# Patient Record
Sex: Male | Born: 1965 | Race: White | Hispanic: No | Smoking: Never smoker
Health system: Southern US, Community
[De-identification: ages and names within clinical notes are randomized; demographics above are authoritative.]

## PROBLEM LIST (undated history)

## (undated) DIAGNOSIS — H47011 Ischemic optic neuropathy, right eye: Secondary | ICD-10-CM

## (undated) DIAGNOSIS — M109 Gout, unspecified: Secondary | ICD-10-CM

## (undated) DIAGNOSIS — L409 Psoriasis, unspecified: Secondary | ICD-10-CM

## (undated) DIAGNOSIS — K56609 Unspecified intestinal obstruction, unspecified as to partial versus complete obstruction: Secondary | ICD-10-CM

## (undated) DIAGNOSIS — J189 Pneumonia, unspecified organism: Secondary | ICD-10-CM

## (undated) DIAGNOSIS — I10 Essential (primary) hypertension: Secondary | ICD-10-CM

## (undated) HISTORY — DX: Gout, unspecified: M10.9

## (undated) HISTORY — DX: Essential (primary) hypertension: I10

## (undated) HISTORY — PX: HERNIA REPAIR: SHX51

## (undated) HISTORY — DX: Psoriasis, unspecified: L40.9

---
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
## 1999-03-21 ENCOUNTER — Emergency Department (HOSPITAL_COMMUNITY): Admission: EM | Admit: 1999-03-21 | Discharge: 1999-03-22 | Payer: Self-pay | Admitting: Emergency Medicine

## 2001-08-22 HISTORY — PX: LASIK: SHX215

## 2006-12-28 ENCOUNTER — Emergency Department (HOSPITAL_COMMUNITY): Admission: EM | Admit: 2006-12-28 | Discharge: 2006-12-28 | Payer: Self-pay | Admitting: Family Medicine

## 2007-01-01 ENCOUNTER — Emergency Department (HOSPITAL_COMMUNITY): Admission: EM | Admit: 2007-01-01 | Discharge: 2007-01-01 | Payer: Self-pay | Admitting: Family Medicine

## 2007-01-22 ENCOUNTER — Ambulatory Visit: Payer: Self-pay | Admitting: Internal Medicine

## 2007-01-22 DIAGNOSIS — I10 Essential (primary) hypertension: Secondary | ICD-10-CM | POA: Insufficient documentation

## 2007-01-22 LAB — CONVERTED CEMR LAB
ALT: 20 units/L (ref 0–40)
AST: 21 units/L (ref 0–37)
Albumin: 4.3 g/dL (ref 3.5–5.2)
Alkaline Phosphatase: 48 units/L (ref 39–117)
BUN: 16 mg/dL (ref 6–23)
Basophils Absolute: 0 10*3/uL (ref 0.0–0.1)
Basophils Relative: 0.6 % (ref 0.0–1.0)
Bilirubin, Direct: 0.1 mg/dL (ref 0.0–0.3)
CO2: 30 meq/L (ref 19–32)
Calcium: 9.3 mg/dL (ref 8.4–10.5)
Chloride: 105 meq/L (ref 96–112)
Cholesterol: 196 mg/dL (ref 0–200)
Creatinine, Ser: 0.9 mg/dL (ref 0.4–1.2)
Eosinophils Absolute: 0.1 10*3/uL (ref 0.0–0.6)
Eosinophils Relative: 2.2 % (ref 0.0–5.0)
GFR calc Af Amer: 89 mL/min
GFR calc non Af Amer: 74 mL/min
Glucose, Bld: 86 mg/dL (ref 70–99)
HCT: 42.4 % (ref 36.0–46.0)
HDL: 44.4 mg/dL (ref >39.0)
Hemoglobin: 15.1 g/dL — ABNORMAL HIGH (ref 12.0–15.0)
LDL Cholesterol: 138 mg/dL — ABNORMAL HIGH (ref 0–99)
Lymphocytes Relative: 35.5 % (ref 12.0–46.0)
MCHC: 35.6 g/dL (ref 30.0–36.0)
MCV: 90.2 fL (ref 78.0–100.0)
Monocytes Absolute: 0.5 10*3/uL (ref 0.2–0.7)
Monocytes Relative: 9.8 % (ref 3.0–11.0)
Neutro Abs: 2.6 10*3/uL (ref 1.4–7.7)
Neutrophils Relative %: 51.9 % (ref 43.0–77.0)
Platelets: 190 10*3/uL (ref 150–400)
Potassium: 4 meq/L (ref 3.5–5.1)
RBC: 4.71 M/uL (ref 3.87–5.11)
RDW: 12.5 % (ref 11.5–14.6)
Sodium: 141 meq/L (ref 135–145)
TSH: 2.52 microintl units/mL (ref 0.35–5.50)
Total Bilirubin: 0.8 mg/dL (ref 0.3–1.2)
Total CHOL/HDL Ratio: 4.4
Total Protein: 6.8 g/dL (ref 6.0–8.3)
Triglycerides: 68 mg/dL (ref 0–149)
VLDL: 14 mg/dL (ref 0–40)
WBC: 5 10*3/uL (ref 4.5–10.5)

## 2007-09-05 ENCOUNTER — Ambulatory Visit: Payer: Self-pay | Admitting: Internal Medicine

## 2008-01-22 ENCOUNTER — Encounter: Payer: Self-pay | Admitting: Internal Medicine

## 2008-02-08 ENCOUNTER — Ambulatory Visit: Payer: Self-pay | Admitting: Internal Medicine

## 2008-02-08 LAB — CONVERTED CEMR LAB
ALT: 22 units/L (ref 0–53)
AST: 20 units/L (ref 0–37)
Albumin: 4.2 g/dL (ref 3.5–5.2)
Alkaline Phosphatase: 43 units/L (ref 39–117)
BUN: 19 mg/dL (ref 6–23)
Basophils Absolute: 0 10*3/uL (ref 0.0–0.1)
Basophils Relative: 0.2 % (ref 0.0–1.0)
Bilirubin Urine: NEGATIVE
Bilirubin, Direct: 0.1 mg/dL (ref 0.0–0.3)
Blood in Urine, dipstick: NEGATIVE
CO2: 29 meq/L (ref 19–32)
Calcium: 9.1 mg/dL (ref 8.4–10.5)
Chloride: 105 meq/L (ref 96–112)
Cholesterol: 176 mg/dL (ref 0–200)
Creatinine, Ser: 1.1 mg/dL (ref 0.4–1.5)
Eosinophils Absolute: 0.3 10*3/uL (ref 0.0–0.7)
Eosinophils Relative: 6.9 % — ABNORMAL HIGH (ref 0.0–5.0)
GFR calc Af Amer: 95 mL/min
GFR calc non Af Amer: 78 mL/min
Glucose, Bld: 92 mg/dL (ref 70–99)
Glucose, Urine, Semiquant: NEGATIVE
HCT: 45.4 % (ref 39.0–52.0)
HDL: 40.9 mg/dL (ref >39.0)
Hemoglobin: 16.2 g/dL (ref 13.0–17.0)
LDL Cholesterol: 120 mg/dL — ABNORMAL HIGH (ref 0–99)
Lymphocytes Relative: 37.6 % (ref 12.0–46.0)
MCHC: 35.6 g/dL (ref 30.0–36.0)
MCV: 93.3 fL (ref 78.0–100.0)
Monocytes Absolute: 0.5 10*3/uL (ref 0.1–1.0)
Monocytes Relative: 10.2 % (ref 3.0–12.0)
Neutro Abs: 2 10*3/uL (ref 1.4–7.7)
Neutrophils Relative %: 45.1 % (ref 43.0–77.0)
Nitrite: NEGATIVE
Platelets: 195 10*3/uL (ref 150–400)
Potassium: 4 meq/L (ref 3.5–5.1)
Protein, U semiquant: NEGATIVE
RBC: 4.86 M/uL (ref 4.22–5.81)
RDW: 12.1 % (ref 11.5–14.6)
Sodium: 140 meq/L (ref 135–145)
Specific Gravity, Urine: >=1.03
TSH: 1.87 microintl units/mL (ref 0.35–5.50)
Total Bilirubin: 0.9 mg/dL (ref 0.3–1.2)
Total CHOL/HDL Ratio: 4.3
Total Protein: 6.8 g/dL (ref 6.0–8.3)
Triglycerides: 76 mg/dL (ref 0–149)
Urobilinogen, UA: 0.2
VLDL: 15 mg/dL (ref 0–40)
WBC Urine, dipstick: NEGATIVE
WBC: 4.5 10*3/uL (ref 4.5–10.5)
pH: 5

## 2008-02-19 ENCOUNTER — Ambulatory Visit: Payer: Self-pay | Admitting: Internal Medicine

## 2008-02-20 ENCOUNTER — Ambulatory Visit (HOSPITAL_COMMUNITY): Admission: RE | Admit: 2008-02-20 | Discharge: 2008-02-20 | Payer: Self-pay | Admitting: Emergency Medicine

## 2009-08-17 ENCOUNTER — Ambulatory Visit: Payer: Self-pay | Admitting: Internal Medicine

## 2009-08-27 ENCOUNTER — Ambulatory Visit: Payer: Self-pay | Admitting: Psychology

## 2009-11-05 ENCOUNTER — Ambulatory Visit: Payer: Self-pay | Admitting: Psychology

## 2009-11-18 ENCOUNTER — Ambulatory Visit: Payer: Self-pay | Admitting: Psychology

## 2009-11-24 ENCOUNTER — Ambulatory Visit: Payer: Self-pay | Admitting: Psychology

## 2009-12-20 ENCOUNTER — Emergency Department (HOSPITAL_COMMUNITY): Admission: EM | Admit: 2009-12-20 | Discharge: 2009-12-20 | Payer: Self-pay | Admitting: Family Medicine

## 2010-02-08 ENCOUNTER — Ambulatory Visit: Payer: Self-pay | Admitting: Internal Medicine

## 2010-02-08 LAB — CONVERTED CEMR LAB
ALT: 32 units/L (ref 0–53)
AST: 27 units/L (ref 0–37)
Albumin: 4.1 g/dL (ref 3.5–5.2)
Alkaline Phosphatase: 42 units/L (ref 39–117)
BUN: 19 mg/dL (ref 6–23)
Basophils Absolute: 0 10*3/uL (ref 0.0–0.1)
Basophils Relative: 0.5 % (ref 0.0–3.0)
Bilirubin Urine: NEGATIVE
Bilirubin, Direct: 0.1 mg/dL (ref 0.0–0.3)
CO2: 27 meq/L (ref 19–32)
Calcium: 9 mg/dL (ref 8.4–10.5)
Chloride: 110 meq/L (ref 96–112)
Cholesterol: 187 mg/dL (ref 0–200)
Creatinine, Ser: 0.9 mg/dL (ref 0.4–1.5)
Eosinophils Absolute: 0.2 10*3/uL (ref 0.0–0.7)
Eosinophils Relative: 4.7 % (ref 0.0–5.0)
GFR calc non Af Amer: 97.63 mL/min (ref >60)
Glucose, Bld: 88 mg/dL (ref 70–99)
Glucose, Urine, Semiquant: NEGATIVE
HCT: 43.2 % (ref 39.0–52.0)
HDL: 42.9 mg/dL (ref >39.00)
Hemoglobin: 15 g/dL (ref 13.0–17.0)
Ketones, urine, test strip: NEGATIVE
LDL Cholesterol: 123 mg/dL — ABNORMAL HIGH (ref 0–99)
Lymphocytes Relative: 35.2 % (ref 12.0–46.0)
Lymphs Abs: 1.8 10*3/uL (ref 0.7–4.0)
MCHC: 34.7 g/dL (ref 30.0–36.0)
MCV: 92.5 fL (ref 78.0–100.0)
Monocytes Absolute: 0.5 10*3/uL (ref 0.1–1.0)
Monocytes Relative: 8.8 % (ref 3.0–12.0)
Neutro Abs: 2.6 10*3/uL (ref 1.4–7.7)
Neutrophils Relative %: 50.8 % (ref 43.0–77.0)
Nitrite: NEGATIVE
Platelets: 178 10*3/uL (ref 150.0–400.0)
Potassium: 3.9 meq/L (ref 3.5–5.1)
Protein, U semiquant: NEGATIVE
RBC: 4.67 M/uL (ref 4.22–5.81)
RDW: 13.4 % (ref 11.5–14.6)
Sodium: 143 meq/L (ref 135–145)
Specific Gravity, Urine: 1.025
TSH: 1.37 microintl units/mL (ref 0.35–5.50)
Total Bilirubin: 0.8 mg/dL (ref 0.3–1.2)
Total CHOL/HDL Ratio: 4
Total Protein: 6.4 g/dL (ref 6.0–8.3)
Triglycerides: 107 mg/dL (ref 0.0–149.0)
Urobilinogen, UA: 0.2
VLDL: 21.4 mg/dL (ref 0.0–40.0)
WBC Urine, dipstick: NEGATIVE
WBC: 5.2 10*3/uL (ref 4.5–10.5)
pH: 5.5

## 2010-02-16 ENCOUNTER — Ambulatory Visit: Payer: Self-pay | Admitting: Psychology

## 2010-02-24 ENCOUNTER — Ambulatory Visit: Payer: Self-pay | Admitting: Internal Medicine

## 2010-04-13 ENCOUNTER — Emergency Department (HOSPITAL_COMMUNITY): Admission: EM | Admit: 2010-04-13 | Discharge: 2010-04-13 | Payer: Self-pay | Admitting: Family Medicine

## 2010-09-21 NOTE — Assessment & Plan Note (Signed)
Summary: cpx//ccm   Vital Signs:  Patient profile:   45 year old male Height:      69 inches Weight:      228 pounds BMI:     33.79 Pulse rate:   68 / minute Pulse rhythm:   regular Resp:     12 per minute BP sitting:   112 / 86  (left arm) Cuff size:   regular  Vitals Entered By: Gladis Riffle, RN (February 24, 2010 8:12 AM) CC: cpx, labs done Is Patient Diabetic? No   CC:  cpx and labs done.  History of Present Illness: cpx stopped BP meds---home BPs 120s/80s or less  Preventive Screening-Counseling & Management  Alcohol-Tobacco     Alcohol drinks/day: <1     Smoking Status: never  Current Problems (verified): 1)  R/O Adhd  (ICD-314.01) 2)  Preventive Health Care  (ICD-V70.0) 3)  Hypertension  (ICD-401.9)  Current Medications (verified): 1)  Clobetasol Propionate 0.05 % Soln (Clobetasol Propionate) .... Apply To Scalp As Needed As Directed 2)  Mometasone Furoate 0.1 % Crea (Mometasone Furoate) .... Apply To Psoriasis As Directed  Allergies (verified): 1)  ! 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  Divorced (remarried) Never Smoked Alcohol use-yes-6pk/week Regular exercise-yes--typically just weight lifting 1 dtr  Risk Factors: Alcohol Use: <1 (02/24/2010) Exercise: yes (01/22/2007)  Risk Factors: Smoking Status: never (02/24/2010)  Family History: Family History Lung cancer-66yo mother-smoker Family History of CAD Male 1st degree relative father CABG 55s (died age 61) Family History Diabetes 1st degree relative father 1/2 sister with lung CA  deceased 53 yo  Social History: 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   Head:  normocephalic and atraumatic.   Eyes:  pupils equal and pupils round.   Ears:  R ear normal and L ear normal.   Neck:  No deformities, masses, or tenderness noted. Chest Wall:  No deformities, masses, tenderness or gynecomastia noted. Lungs:  Normal respiratory effort, chest expands symmetrically. Lungs are clear to auscultation, no crackles or wheezes. Heart:  normal rate and regular rhythm.   Abdomen:  Bowel sounds positive,abdomen soft and non-tender without masses, organomegaly or hernias noted. Rectal:  no external abnormalities and no hemorrhoids.   Prostate:  no gland enlargement, no nodules, and no asymmetry.   Msk:  No deformity or scoliosis noted of thoracic or lumbar spine.   Pulses:  R radial normal and L radial normal.   Neurologic:  cranial nerves II-XII intact and gait normal.   Skin:  turgor normal and color normal.   Psych:  memory intact for recent and remote and not anxious appearing.     Impression & Recommendations:  Problem # 1:  PREVENTIVE HEALTH CARE (ICD-V70.0)  health maint UTD  Problem # 2:  HYPERTENSION (ICD-401.9)  BP well controlled off meds The following medications were removed from the medication list:    Enalapril Maleate 10 Mg Tabs (Enalapril maleate) .Marland Kitchen... Take 1 tablet by mouth once a day  BP today: 112/86 Prior BP: 130/90 (08/17/2009)  Prior  10 Yr Risk Heart Disease: Not enough information (01/22/2007)  Labs Reviewed: K+: 3.9 (02/08/2010) Creat: : 0.9 (02/08/2010)   Chol: 187 (02/08/2010)   HDL: 42.90 (02/08/2010)   LDL: 123 (02/08/2010)   TG: 107.0 (02/08/2010)  Complete Medication List: 1)  Clobetasol Propionate 0.05 % Soln (Clobetasol propionate) .... Apply to scalp as needed as  directed 2)  Mometasone Furoate 0.1 % Crea (Mometasone furoate) .... Apply to psoriasis as directed

## 2010-09-21 NOTE — Assessment & Plan Note (Signed)
Summary: concult re: anger issues/? ADD/cjr   Vital Signs:  Patient profile:   45 year old male Height:      69 inches Weight:      224 pounds BMI:     33.20 BP sitting:   130 / 90  (left arm) Cuff size:   regular  Vitals Entered By: Kern Reap CMA Duncan Dull) (August 17, 2009 9:56 AM)  Reason for Visit possible add, behavior issues  History of Present Illness: a lot of stress re cently.  married---recently wife is pregnant he admits to anger issues, wife tells him he doesn't listen.  Current Problems (verified): 1)  Preventive Health Care  (ICD-V70.0) 2)  Hypertension  (ICD-401.9)  Current Medications (verified): 1)  Enalapril Maleate 10 Mg Tabs (Enalapril Maleate) .... 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   Head:  normocephalic and atraumatic.   Eyes:  pupils equal and pupils round.   Ears:  R ear normal and L ear normal.   Neck:  No deformities, masses, or tenderness noted. Chest Wall:  No deformities, masses, tenderness or gynecomastia noted. Lungs:  Normal respiratory effort, chest expands symmetrically. Lungs are clear to auscultation, no crackles or wheezes. Heart:  Normal  rate and regular rhythm. S1 and S2 normal without gallop, murmur, click, rub or other extra sounds. Abdomen:  Bowel sounds positive,abdomen soft and non-tender without masses, organomegaly or hernias noted. Msk:  No deformity or scoliosis noted of thoracic or lumbar spine.   Pulses:  R radial normal and L radial normal.   Neurologic:  cranial nerves II-XII intact and gait normal.     Impression & Recommendations:  Problem # 1:  HYPERTENSION (ICD-401.9) he will start being compliant His updated medication list for this problem includes:    Enalapril Maleate 10 Mg Tabs (Enalapril maleate) .Marland Kitchen... Take 1 tablet by mouth once a day  BP today: 130/90 Prior BP: 124/90 (02/19/2008)  Prior 10 Yr Risk Heart Disease: Not enough information (01/22/2007)  Labs Reviewed: K+: 4.0 (02/08/2008) Creat: : 1.1 (02/08/2008)   Chol: 176 (02/08/2008)   HDL: 40.9 (02/08/2008)   LDL: 120 (02/08/2008)   TG: 76 (02/08/2008)  Problem # 2:  R/O ADHD (ICD-314.01)  he works 3-4 shifts at night per week  Orders: Psychology Referral (Psychology)  Complete Medication List: 1)  Enalapril Maleate 10 Mg Tabs (Enalapril maleate) .... Take 1 tablet by mouth once a day Prescriptions: ENALAPRIL MALEATE 10 MG TABS (ENALAPRIL MALEATE) Take 1 tablet by mouth once a day  #90 x 3   Entered and Authorized by:   Birdie Sons MD   Signed by:   Birdie Sons MD on 08/17/2009   Method used:   Electronically to        [facility]* (retail)  [address]       [address] Shipping/mailing       [place], [place]  [address]       Ph: [phone]       Fax: [phone]   RxID:   [id number]

## 2010-09-21 NOTE — Assessment & Plan Note (Signed)
History of Present Illness: HTN-no headache no neurologic deficit-tolerating meds (enalapril) without dificulty. Home BPs 120s/80s  Otherwise feels well  Hypertension History:      She denies headache, chest pain, palpitations, dyspnea with exertion, orthopnea, PND, peripheral edema, visual symptoms, neurologic problems, syncope, and side effects from treatment.        Positive major cardiovascular risk factors include hypertension and family history for ischemic heart disease (males less than 64 years old).  Negative major cardiovascular risk factors include male age less than 47 years old, no history of diabetes or hyperlipidemia, and non-tobacco-user status.        Further assessment for target organ damage reveals no history of ASHD, cardiac end-organ damage (CHF/LVH), stroke/TIA, peripheral vascular disease, renal insufficiency, or hypertensive retinopathy.       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        Divorced    Never Smoked    Alcohol use-yes-6pk/week    Regular exercise-yes--typically just weight lifting   Risk Factors:  Tobacco use:  never Alcohol use:  yes    Drinks per day:  <1    Counseled to quit/cut down alcohol use:  no Exercise:  yes  Family History Risk Factors:    Family History of MI in males < 36 years old:  yes   Review of Systems  The patient denies anorexia, fever, weight loss, vision loss, decreased hearing, hoarseness, chest pain, syncope, dyspnea on exhertion, peripheral edema, prolonged cough, hemoptysis, abdominal pain, melena, hematochezia, severe indigestion/heartburn, hematuria, incontinence, genital sores, muscle weakness, suspicious skin lesions, transient  blindness, difficulty walking, depression, unusual weight change, abnormal bleeding, enlarged lymph nodes, angioedema, breast masses, and testicular masses.     Physical Exam  General:     Well-developed,well-nourished,in no acute distress; alert,appropriate and cooperative throughout examination Head:     Normocephalic and atraumatic without obvious abnormalities. No apparent alopecia or balding. Nose:     External nasal examination shows no deformity or inflammation. Nasal mucosa are pink and moist without lesions or exudates. Mouth:     Oral mucosa and oropharynx without lesions or exudates.  Teeth in good repair. Neck:     No deformities, masses, or tenderness noted. Heart:     Normal rate and regular rhythm. S1 and S2 normal without gallop, murmur, click, rub or other extra sounds. Abdomen:     Bowel sounds positive,abdomen soft and non-tender without masses, organomegaly or hernias noted. Msk:     No deformity or scoliosis noted of thoracic or lumbar spine.   Neurologic:     No cranial nerve deficits noted. Station and gait are normal. Plantar reflexes are down-going bilaterally. DTRs are symmetrical throughout. Sensory, motor and coordinative functions appear intact.    Impression & Recommendations:  Problem # 1:  HYPERTENSION (ICD-401.9)  10 Yr Risk Heart Disease: Not enough information   adequate control enalapril  Problem # 2:  Preventive Health Care (ICD-V70.0) cpx labs daily exercise encouraged. Weight loss advised aerobiic exercise  Hypertension Assessment/Plan:      The patient's hypertensive risk group is category B: At least one risk factor (excluding diabetes) with no target organ damage.

## 2010-09-21 NOTE — Assessment & Plan Note (Signed)
Summary: cant sleep/mhf  Medications Added ENALAPRIL MALEATE 10 MG TABS (ENALAPRIL MALEATE) Take 1 tablet by mouth once a day AMBIEN 5 MG TABS (ZOLPIDEM TARTRATE) 1-2 by mouth at bedtime as needed      Allergies Added: NKDA  Vital Signs:  Patient Profile:   45 Years Old Male Weight:      208 pounds Temp:     97.9 degrees F Pulse rate:   66 / minute BP sitting:   100 / 72  (left arm)  Vitals Entered By: Gladis Riffle, RN (September 05, 2007 1:43 PM)                 Chief Complaint:  c/o difficulty sleeping X 2 years and getting worse--does work 3rd shift.  History of Present Illness: Works 3rd shift---7pm-7am goes to sleep at 8am. Wakes up at noon or 1 pm.   admits to depression---recent break up---no suicidal thoughts.   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   Head:     normocephalic and atraumatic.   Eyes:     pupils equal and pupils round.   Neck:     No deformities, masses, or tenderness noted. Lungs:     Normal respiratory effort, chest expands symmetrically. Lungs are clear to auscultation, no crackles or wheezes. Heart:     Normal rate and regular rhythm. S1 and S2  normal without gallop, murmur, click, rub or other extra sounds. Psych:     normally interactive, good eye contact, not anxious appearing, and not agitated.      Impression & Recommendations:  Problem # 1:  INSOMNIA (ICD-780.52) trail meds insomnia is likely secondary to shift work His updated medication list for this problem includes:    Ambien 5 Mg Tabs (Zolpidem tartrate) .Marland Kitchen... 1-2 by mouth at bedtime as needed  grief reaction/stress reaction related to break up with girlfriend---call in one month if not improving  Complete Medication List: 1)  Enalapril Maleate 10 Mg Tabs (Enalapril maleate) .... Take 1 tablet by mouth once a day 2)  Ambien 5 Mg Tabs (Zolpidem tartrate) .Marland Kitchen.. 1-2 by mouth at bedtime as needed     Prescriptions: AMBIEN 5 MG TABS (ZOLPIDEM TARTRATE) 1-2 by mouth at bedtime as needed  #30 x 1   Entered and Authorized by:   Birdie Sons MD   Signed by:   Birdie Sons MD on 09/05/2007  Method used:   Print then Give to Patient   RxID:   [id number]  ]

## 2010-11-09 LAB — POCT RAPID STREP A (OFFICE): Streptococcus, Group A Screen (Direct): POSITIVE — AB

## 2011-03-11 ENCOUNTER — Inpatient Hospital Stay (INDEPENDENT_AMBULATORY_CARE_PROVIDER_SITE_OTHER)
Admission: RE | Admit: 2011-03-11 | Discharge: 2011-03-11 | Disposition: A | Discharge status: Home / Self Care | Payer: 59 | Source: Ambulatory Visit | Attending: Emergency Medicine | Admitting: Emergency Medicine

## 2011-03-11 DIAGNOSIS — IMO0002 Reserved for concepts with insufficient information to code with codable children: Secondary | ICD-10-CM

## 2011-07-22 ENCOUNTER — Other Ambulatory Visit: Payer: Self-pay | Admitting: *Deleted

## 2011-07-22 MED ORDER — ACYCLOVIR 5 % EX OINT: TOPICAL_OINTMENT | Freq: Three times a day (TID) | CUTANEOUS | Status: DC

## 2011-07-22 NOTE — Telephone Encounter (Signed)
zoviax cream, apply tid prn for 5 days

## 2011-07-22 NOTE — Telephone Encounter (Signed)
Rx sent to pharmacy   

## 2011-07-22 NOTE — Telephone Encounter (Signed)
Patient is requesting a prescription for zovirax for a cold sore.  If possible please send to [facility].

## 2011-09-19 ENCOUNTER — Other Ambulatory Visit: Payer: 59

## 2011-09-19 ENCOUNTER — Other Ambulatory Visit (INDEPENDENT_AMBULATORY_CARE_PROVIDER_SITE_OTHER): Payer: 59

## 2011-09-19 DIAGNOSIS — Z0000 Encounter for general adult medical examination without abnormal findings: Secondary | ICD-10-CM

## 2011-09-19 LAB — POCT URINALYSIS DIPSTICK
Bilirubin, UA: NEGATIVE
Blood, UA: NEGATIVE
Glucose, UA: NEGATIVE
Ketones, UA: NEGATIVE
Leukocytes, UA: NEGATIVE
Nitrite, UA: NEGATIVE
Protein, UA: NEGATIVE
Spec Grav, UA: 1.025
Urobilinogen, UA: 0.2
pH, UA: 5.5

## 2011-09-19 LAB — CBC WITH DIFFERENTIAL/PLATELET
Basophils Absolute: 0 10*3/uL (ref 0.0–0.1)
Basophils Relative: 0.7 % (ref 0.0–3.0)
Eosinophils Absolute: 0.2 10*3/uL (ref 0.0–0.7)
Eosinophils Relative: 2.7 % (ref 0.0–5.0)
HCT: 43.9 % (ref 39.0–52.0)
Hemoglobin: 15.3 g/dL (ref 13.0–17.0)
Lymphocytes Relative: 30.8 % (ref 12.0–46.0)
Lymphs Abs: 2.1 10*3/uL (ref 0.7–4.0)
MCHC: 34.7 g/dL (ref 30.0–36.0)
MCV: 92 fl (ref 78.0–100.0)
Monocytes Absolute: 0.6 10*3/uL (ref 0.1–1.0)
Monocytes Relative: 8.2 % (ref 3.0–12.0)
Neutro Abs: 3.9 10*3/uL (ref 1.4–7.7)
Neutrophils Relative %: 57.6 % (ref 43.0–77.0)
Platelets: 172 10*3/uL (ref 150.0–400.0)
RBC: 4.78 Mil/uL (ref 4.22–5.81)
RDW: 13.6 % (ref 11.5–14.6)
WBC: 6.8 10*3/uL (ref 4.5–10.5)

## 2011-09-19 LAB — BASIC METABOLIC PANEL
BUN: 21 mg/dL (ref 6–23)
CO2: 26 mEq/L (ref 19–32)
Calcium: 9 mg/dL (ref 8.4–10.5)
Chloride: 109 mEq/L (ref 96–112)
Creatinine, Ser: 1 mg/dL (ref 0.4–1.5)
GFR: 87.84 mL/min (ref >60.00)
Glucose, Bld: 96 mg/dL (ref 70–99)
Potassium: 4.1 mEq/L (ref 3.5–5.1)
Sodium: 143 mEq/L (ref 135–145)

## 2011-09-19 LAB — HEPATIC FUNCTION PANEL
ALT: 19 U/L (ref 0–53)
AST: 16 U/L (ref 0–37)
Albumin: 4.1 g/dL (ref 3.5–5.2)
Alkaline Phosphatase: 43 U/L (ref 39–117)
Bilirubin, Direct: 0 mg/dL (ref 0.0–0.3)
Total Bilirubin: 0.4 mg/dL (ref 0.3–1.2)
Total Protein: 6.7 g/dL (ref 6.0–8.3)

## 2011-09-19 LAB — LIPID PANEL
Cholesterol: 171 mg/dL (ref 0–200)
HDL: 50 mg/dL (ref >39.00)
LDL Cholesterol: 105 mg/dL — ABNORMAL HIGH (ref 0–99)
Total CHOL/HDL Ratio: 3
Triglycerides: 80 mg/dL (ref 0.0–149.0)
VLDL: 16 mg/dL (ref 0.0–40.0)

## 2011-09-19 LAB — TSH: TSH: 1.52 u[IU]/mL (ref 0.35–5.50)

## 2011-09-30 ENCOUNTER — Ambulatory Visit (INDEPENDENT_AMBULATORY_CARE_PROVIDER_SITE_OTHER): Payer: 59 | Admitting: Internal Medicine

## 2011-09-30 ENCOUNTER — Encounter: Payer: Self-pay | Admitting: Internal Medicine

## 2011-09-30 VITALS — BP 116/88 | HR 64 | Temp 97.6°F | Ht 69.0 in | Wt 222.0 lb

## 2011-09-30 DIAGNOSIS — L408 Other psoriasis: Secondary | ICD-10-CM

## 2011-09-30 DIAGNOSIS — Z0000 Encounter for general adult medical examination without abnormal findings: Secondary | ICD-10-CM

## 2011-09-30 DIAGNOSIS — L409 Psoriasis, unspecified: Secondary | ICD-10-CM | POA: Insufficient documentation

## 2011-09-30 MED ORDER — ACYCLOVIR 5 % EX OINT: TOPICAL_OINTMENT | Freq: Three times a day (TID) | CUTANEOUS | Status: AC

## 2011-09-30 NOTE — Progress Notes (Signed)
Patient ID: Jermaine Walls, male   DOB: 03/20/1966, 46 y.o.   MRN: [id number] cpx  Past Medical History  Diagnosis Date  . Hypertension     History   Social History  . Marital Status: Married    Spouse Name: N/A    Number of Children: N/A  . Years of Education: N/A   Occupational History  . Not on file.   Social History Main Topics  . Smoking status: Never Smoker   . Smokeless tobacco: Not on file  . Alcohol Use: Yes  . Drug Use: No  . Sexually Active: Not on file   Other Topics Concern  . Not on file   Social History Narrative  . No narrative on file    No past surgical history on file.  Family History  Problem Relation Age of Onset  . Cancer Mother     lung  . Heart disease Father   . Diabetes Father   . Cancer Sister     lung    Allergies  Allergen Reactions  . Doxycycline Hyclate     REACTION: rash, itch    No current outpatient prescriptions on file prior to visit.     patient denies chest pain, shortness of breath, orthopnea. Denies lower extremity edema, abdominal pain, change in appetite, change in bowel movements. Patient denies rashes, musculoskeletal complaints. No other specific complaints in a complete review of systems.   BP 116/88  Pulse 64  Temp(Src) 97.6 F (36.4 C) (Oral)  Ht 5\' 9"  (1.753 m)  Wt 222 lb (100.699 kg)  BMI 32.78 kg/m2 Well-developed male in no acute distress. HEENT exam atraumatic, normocephalic, extraocular muscles are intact. Conjunctivae are pink without exudate. Neck is supple without lymphadenopathy, thyromegaly, jugular venous distention. Chest is clear to auscultation without increased work of breathing. Cardiac exam S1-S2 are regular. The PMI is normal. No significant murmurs or gallops. Abdominal exam active bowel sounds, soft, nontender. No abdominal bruits. Extremities no clubbing cyanosis or edema. Peripheral pulses are normal without bruits. Neurologic exam alert and oriented without any motor or sensory  deficits.   Well Visit: health maint UTD.

## 2012-01-23 ENCOUNTER — Emergency Department
Admit: 2012-01-23 | Discharge: 2012-01-23 | Disposition: A | Discharge status: Home / Self Care | Payer: 59 | Attending: Family Medicine | Admitting: Family Medicine

## 2012-01-23 ENCOUNTER — Emergency Department
Admission: EM | Admit: 2012-01-23 | Discharge: 2012-01-23 | Disposition: A | Discharge status: Home / Self Care | Payer: 59 | Source: Home / Self Care | Attending: Family Medicine | Admitting: Family Medicine

## 2012-01-23 ENCOUNTER — Telehealth: Payer: Self-pay | Admitting: *Deleted

## 2012-01-23 ENCOUNTER — Encounter: Payer: Self-pay | Admitting: *Deleted

## 2012-01-23 DIAGNOSIS — S80812A Abrasion, left lower leg, initial encounter: Secondary | ICD-10-CM

## 2012-01-23 DIAGNOSIS — S8392XA Sprain of unspecified site of left knee, initial encounter: Secondary | ICD-10-CM

## 2012-01-23 DIAGNOSIS — IMO0002 Reserved for concepts with insufficient information to code with codable children: Secondary | ICD-10-CM

## 2012-01-23 MED ORDER — MUPIROCIN 2 % EX OINT: TOPICAL_OINTMENT | Freq: Three times a day (TID) | CUTANEOUS | Status: AC
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
## 2012-01-23 NOTE — Discharge Instructions (Signed)
Apply Bactroban and change dressing to abrasions daily until healed.  Wear knee sleeve daytime.  Continue ice pack two or three times daily.  May continue Ibuprofen 200mg , 4 tabs every 8 hours with food.  Begin knee exercises in about 5 days (leg raises).

## 2012-07-05 ENCOUNTER — Other Ambulatory Visit (HOSPITAL_COMMUNITY): Payer: Self-pay | Admitting: Orthopedic Surgery

## 2012-07-05 DIAGNOSIS — M25562 Pain in left knee: Secondary | ICD-10-CM

## 2012-07-09 ENCOUNTER — Ambulatory Visit (HOSPITAL_COMMUNITY): Admission: RE | Admit: 2012-07-09 | Payer: 59 | Source: Ambulatory Visit

## 2012-07-10 ENCOUNTER — Ambulatory Visit (HOSPITAL_COMMUNITY)
Admission: RE | Admit: 2012-07-10 | Discharge: 2012-07-10 | Disposition: A | Discharge status: Home / Self Care | Payer: 59 | Source: Ambulatory Visit | Attending: Orthopedic Surgery | Admitting: Orthopedic Surgery

## 2012-07-10 DIAGNOSIS — M25562 Pain in left knee: Secondary | ICD-10-CM

## 2012-07-10 DIAGNOSIS — IMO0002 Reserved for concepts with insufficient information to code with codable children: Secondary | ICD-10-CM | POA: Insufficient documentation

## 2012-07-10 DIAGNOSIS — M76899 Other specified enthesopathies of unspecified lower limb, excluding foot: Secondary | ICD-10-CM | POA: Insufficient documentation

## 2012-07-10 DIAGNOSIS — M2240 Chondromalacia patellae, unspecified knee: Secondary | ICD-10-CM | POA: Insufficient documentation

## 2012-07-10 DIAGNOSIS — M25569 Pain in unspecified knee: Secondary | ICD-10-CM | POA: Insufficient documentation

## 2013-02-19 ENCOUNTER — Ambulatory Visit (INDEPENDENT_AMBULATORY_CARE_PROVIDER_SITE_OTHER): Payer: 59 | Admitting: Family Medicine

## 2013-02-19 ENCOUNTER — Encounter: Payer: Self-pay | Admitting: Family Medicine

## 2013-02-19 VITALS — BP 92/64 | Temp 98.2°F | Wt 205.0 lb

## 2013-02-19 DIAGNOSIS — J069 Acute upper respiratory infection, unspecified: Secondary | ICD-10-CM

## 2013-02-19 MED ORDER — HYDROCODONE-HOMATROPINE 5-1.5 MG/5ML PO SYRP: 5.0000 mL | ORAL_SOLUTION | Freq: Every evening | ORAL | Status: DC | PRN

## 2013-02-19 NOTE — Patient Instructions (Signed)
INSTRUCTIONS FOR UPPER RESPIRATORY INFECTION:  -plenty of rest and fluids  -nasal saline wash 2-3 times daily (use prepackaged nasal saline or bottled/distilled water if making your own)   -can use afrin or sinex nasal spray for drainage and nasal congestion - but do NOT use longer then 3-4 days  -can use tylenol or ibuprofen as directed for aches and sorethroat  -if you are taking a cough medication - use only as directed, may also try a teaspoon of honey to coat the throat and throat lozenges  -for sore throat, salt water gargles can help  -follow up if you have fevers, facial pain, tooth pain, difficulty breathing or are worsening or not getting better in 5-7 days  

## 2013-02-19 NOTE — Progress Notes (Signed)
Chief Complaint  Patient presents with  . Sore Throat    congestion, low grade fever, cough, body aches, nausea since last Wednesday     HPI:  Acute sick visit: -started: 6 days ago, actually a little better today but cough persisits -symptoms: per above + sore throat -denies: ear pain, tooth pain, sinus pain, SOB, vomiting, strep exposure -has tried: musinex -sick contacts: daughter with similar symptoms ROS: See pertinent positives and negatives per HPI.  Past Medical History  Diagnosis Date  . History of hypertension     Family History  Problem Relation Age of Onset  . Cancer Mother     lung  . Heart disease Father   . Diabetes Father   . Cancer Sister     lung    History   Social History  . Marital Status: Legally Separated    Spouse Name: N/A    Number of Children: N/A  . Years of Education: N/A   Social History Main Topics  . Smoking status: Never Smoker   . Smokeless tobacco: None  . Alcohol Use: Yes  . Drug Use: No  . Sexually Active: None   Other Topics Concern  . None   Social History Narrative  . None    Current outpatient prescriptions:clobetasol (TEMOVATE) 0.05 % external solution, Apply 1 application topically as needed., Disp: , Rfl: ;  HYDROcodone-homatropine (HYCODAN) 5-1.5 MG/5ML syrup, Take 5 mLs by mouth at bedtime as needed for cough., Disp: 120 mL, Rfl: 0  EXAM:  Filed Vitals:   02/19/13 0839  BP: 92/64  Temp: 98.2 F (36.8 C)    Body mass index is 29.41 kg/(m^2).  GENERAL: vitals reviewed and listed above, alert, oriented, appears well hydrated and in no acute distress  HEENT: atraumatic, conjunttiva clear, no obvious abnormalities on inspection of external nose and ears, normal appearance of ear canals and TMs, clear nasal congestion, mild post oropharyngeal erythema with PND, no tonsillar edema or exudate, no sinus TTP  NECK: no obvious masses on inspection  LUNGS: clear to auscultation bilaterally, no wheezes, rales or  rhonchi, good air movement  CV: HRRR, no peripheral edema  ABD: BS+, soft, NTTP  MS: moves all extremities without noticeable abnormality  PSYCH: pleasant and cooperative, no obvious depression or anxiety  ASSESSMENT AND PLAN:  Discussed the following assessment and plan:  Upper respiratory infection - Plan: HYDROcodone-homatropine (HYCODAN) 5-1.5 MG/5ML syrup  -discussed this is likely viral. Advised supportive care and return precautions. -Patient advised to return or notify a doctor immediately if symptoms worsen or persist or new concerns arise.  Patient Instructions  INSTRUCTIONS FOR UPPER RESPIRATORY INFECTION:  -plenty of rest and fluids  -nasal saline wash 2-3 times daily (use prepackaged nasal saline or bottled/distilled water if making your own)   -can use afrin or sinex nasal spray for drainage and nasal congestion - but do NOT use longer then 3-4 days  -can use tylenol or ibuprofen as directed for aches and sorethroat  -if you are taking a cough medication - use only as directed, may also try a teaspoon of honey to coat the throat and throat lozenges  -for sore throat, salt water gargles can help  -follow up if you have fevers, facial pain, tooth pain, difficulty breathing or are worsening or not getting better in 5-7 days      Chelsea Nusz R.

## 2013-09-11 ENCOUNTER — Telehealth: Payer: Self-pay | Admitting: Internal Medicine

## 2013-09-11 NOTE — Telephone Encounter (Signed)
Pt would like to know if you would do his cpe in mid July? Pt works in the [place] at [employer] and needs it done after 3pm on a Monday due to work schedule. Would you mind?

## 2013-09-11 NOTE — Telephone Encounter (Signed)
ok 

## 2013-09-12 NOTE — Telephone Encounter (Signed)
done

## 2014-02-24 ENCOUNTER — Other Ambulatory Visit: Payer: 59

## 2014-02-25 ENCOUNTER — Other Ambulatory Visit (INDEPENDENT_AMBULATORY_CARE_PROVIDER_SITE_OTHER): Payer: 59

## 2014-02-25 DIAGNOSIS — Z0000 Encounter for general adult medical examination without abnormal findings: Secondary | ICD-10-CM

## 2014-02-25 LAB — LIPID PANEL
Cholesterol: 217 mg/dL — ABNORMAL HIGH (ref 0–200)
HDL: 62.6 mg/dL (ref >39.00)
LDL Cholesterol: 125 mg/dL — ABNORMAL HIGH (ref 0–99)
NonHDL: 154.4
Total CHOL/HDL Ratio: 3
Triglycerides: 146 mg/dL (ref 0.0–149.0)
VLDL: 29.2 mg/dL (ref 0.0–40.0)

## 2014-02-25 LAB — CBC WITH DIFFERENTIAL/PLATELET
Basophils Absolute: 0 10*3/uL (ref 0.0–0.1)
Basophils Relative: 0.4 % (ref 0.0–3.0)
Eosinophils Absolute: 0.2 10*3/uL (ref 0.0–0.7)
Eosinophils Relative: 3.1 % (ref 0.0–5.0)
HCT: 43.5 % (ref 39.0–52.0)
Hemoglobin: 14.6 g/dL (ref 13.0–17.0)
Lymphocytes Relative: 31.5 % (ref 12.0–46.0)
Lymphs Abs: 2 10*3/uL (ref 0.7–4.0)
MCHC: 33.7 g/dL (ref 30.0–36.0)
MCV: 93.6 fl (ref 78.0–100.0)
Monocytes Absolute: 0.5 10*3/uL (ref 0.1–1.0)
Monocytes Relative: 8.5 % (ref 3.0–12.0)
Neutro Abs: 3.5 10*3/uL (ref 1.4–7.7)
Neutrophils Relative %: 56.5 % (ref 43.0–77.0)
Platelets: 190 10*3/uL (ref 150.0–400.0)
RBC: 4.65 Mil/uL (ref 4.22–5.81)
RDW: 13.5 % (ref 11.5–15.5)
WBC: 6.3 10*3/uL (ref 4.0–10.5)

## 2014-02-25 LAB — POCT URINALYSIS DIPSTICK
Bilirubin, UA: NEGATIVE
Blood, UA: NEGATIVE
Glucose, UA: NEGATIVE
Ketones, UA: NEGATIVE
Leukocytes, UA: NEGATIVE
Nitrite, UA: NEGATIVE
Protein, UA: NEGATIVE
Spec Grav, UA: 1.02
Urobilinogen, UA: 0.2
pH, UA: 6.5

## 2014-02-25 LAB — HEPATIC FUNCTION PANEL
ALT: 22 U/L (ref 0–53)
AST: 27 U/L (ref 0–37)
Albumin: 4.4 g/dL (ref 3.5–5.2)
Alkaline Phosphatase: 43 U/L (ref 39–117)
Bilirubin, Direct: 0.1 mg/dL (ref 0.0–0.3)
Total Bilirubin: 1.1 mg/dL (ref 0.2–1.2)
Total Protein: 7 g/dL (ref 6.0–8.3)

## 2014-02-25 LAB — BASIC METABOLIC PANEL
BUN: 17 mg/dL (ref 6–23)
CO2: 29 mEq/L (ref 19–32)
Calcium: 9.4 mg/dL (ref 8.4–10.5)
Chloride: 101 mEq/L (ref 96–112)
Creatinine, Ser: 0.9 mg/dL (ref 0.4–1.5)
GFR: 97.13 mL/min (ref >60.00)
Glucose, Bld: 93 mg/dL (ref 70–99)
Potassium: 3.7 mEq/L (ref 3.5–5.1)
Sodium: 136 mEq/L (ref 135–145)

## 2014-02-26 LAB — TSH: TSH: 2 u[IU]/mL (ref 0.35–4.50)

## 2014-03-03 ENCOUNTER — Encounter: Payer: 59 | Admitting: Internal Medicine

## 2014-03-18 ENCOUNTER — Ambulatory Visit (INDEPENDENT_AMBULATORY_CARE_PROVIDER_SITE_OTHER): Payer: 59 | Admitting: Family Medicine

## 2014-03-18 ENCOUNTER — Encounter: Payer: Self-pay | Admitting: Family Medicine

## 2014-03-18 VITALS — BP 146/90 | HR 67 | Temp 97.9°F | Resp 20 | Ht 70.0 in | Wt 224.0 lb

## 2014-03-18 DIAGNOSIS — L409 Psoriasis, unspecified: Secondary | ICD-10-CM

## 2014-03-18 DIAGNOSIS — I10 Essential (primary) hypertension: Secondary | ICD-10-CM

## 2014-03-18 DIAGNOSIS — Z0000 Encounter for general adult medical examination without abnormal findings: Secondary | ICD-10-CM

## 2014-03-18 NOTE — Progress Notes (Signed)
Jermaine Reddish, MD Phone: [phone]  Subjective:  Patient presents today to establish care with me as PCP. Chief complaint-noted.   Hypertension Obesity BP Readings from Last 3 Encounters:  03/18/14 146/90  02/19/13 92/64  01/23/12 130/81  Home BP monitoring-no Compliant with medications-no current medications, Previously on lisinopril 10mg  Exercise-rides bikes 50-60 miles at a time 4x a week now, took some time off sept-jan and gained 20-30 lbs ROS-Denies any CP, HA, SOB, blurry vision, LE edema, transient weakness, orthopnea, PND.   Psoriasis is well controlled except in ear canal. Only intermittently seeing dermatology at this point.   The following were reviewed and entered/updated in epic: Past Medical History  Diagnosis Date  . History of hypertension   . Psoriasis     Danella Sensing, MD dermatology   Patient Active Problem List   Diagnosis Date Noted  . HYPERTENSION 01/22/2007    Priority: Medium  . Psoriasis 09/30/2011    Priority: Low   No past surgical history on file.  Family History  Problem Relation Age of Onset  . Cancer Mother     lung, smoker  . Heart disease Father     11, mid to late 47s first bypass. grandfather died at 91.   . Diabetes Father   . Cancer Sister     lung, smoker    Medications- reviewed and updated Current Outpatient Prescriptions  Medication Sig Dispense Refill  . ibuprofen (ADVIL,MOTRIN) 200 MG tablet Take 200-400 mg by mouth every 6 (six) hours as needed.      . naproxen sodium (ALEVE) 220 MG tablet Take 220 mg by mouth as needed.       No current facility-administered medications for this visit.   Allergies-reviewed and updated Allergies  Allergen Reactions  . Doxycycline Hyclate     REACTION: rash, itch   History   Social History  . Marital Status: Legally Separated    Spouse Name: N/A    Number of Children: N/A  . Years of Education: N/A   Social History Main Topics  . Smoking status: Never Smoker   .  Smokeless tobacco: None  . Alcohol Use: Yes     Comment: 6-10 beers  . Drug Use: No  . Sexual Activity: Yes    Partners: Female     Comment: monogomous relationship-no STD testing desired   Other Topics Concern  . 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 Oropharynx normal. TM obscured by light wax. Mild scaling in ear canal and outer ear. Does not appear to be actinic keratosis.  Neck: no thyromegaly CV: RRR no murmurs rubs or gallops Lungs: CTAB no crackles, wheeze, rhonchi Abdomen: soft/nontender/nondistended/normal bowel sounds. No rebound or guarding. No hepatosplenomegaly Ext: no edema Skin: warm, dry. Full skin exam completed and no suspicious lesions noted.  Neuro: grossly normal, moves all extremities, PERRLA  Assessment/Plan:  HYPERTENSION Poorly controlled with weight increase by 20 lbs. Labwork reviewed and unremarkable. 2.8% 10 year risk  of heart attack or stroke. Discussed this is likely higher given family history of heart disease. Suggested adding aspirin for now and will continue cholesterol discussion yearly.   Psoriasis Knees better. Only in outer ear and some in ear canal now.    Health Maintenance counseling: 1. Anticipatory guidance: Patient counseled regarding regular dental exams, wearing seatbelts.  2. Risk factor reduction:  Advised patient of need for regular exercise and diet rich and fruits and vegetables to reduce risk of heart attack and stroke.  3. Immunizations/screenings/ancillary studies: none  needed 4. Reviewed labs.

## 2014-03-18 NOTE — Patient Instructions (Signed)
Hypertension  Continue exercise, work on adding fruits and vegetables (dash diet below)  See [place] back in 3 months for weight and blood pressure recheck (210 given your frame would probably be reasonable)  Heart disease history  Add aspirin 81mg  daily  My 5 to Fitness!  5: fruits and vegetables per day (work on 9 per day if you are at 5) 4: exercise 4-5 times per week for at least 30 minutes (walking counts!)- already doing 3: meals per day (don't skip breakfast!) 2: habits to quit -smoking -excess alcohol use (men >2 beer/day; women >1beer/day). Cutting down may help.  1: sweet per day (2 cookies, 1 small cup of ice cream, 12 oz soda) Maintain BMI below 30. Current BMI 32.   These are general tips for healthy living. Try to start with 1 or 2 habit TODAY and make it a part of your life for several months. You set a goal today to work on: Increasing fruits and vegetables  Once you have 1 or 2 habits down for several months, try to begin working on your next healthy habit. With every single step you take, you will be leading a healthier lifestyle!  DASH Eating Plan DASH stands for "Dietary Approaches to Stop Hypertension." The DASH eating plan is a healthy eating plan that has been shown to reduce high blood pressure (hypertension). Additional health benefits may include reducing the risk of type 2 diabetes mellitus, heart disease, and stroke. The DASH eating plan may also help with weight loss. WHAT DO I NEED TO KNOW ABOUT THE DASH EATING PLAN? For the DASH eating plan, you will follow these general guidelines:  Choose foods with a percent daily value for sodium of less than 5% (as listed on the food label).  Use salt-free seasonings or herbs instead of table salt or sea salt.  Check with your health care provider or pharmacist before using salt substitutes.  Eat lower-sodium products, often labeled as "lower sodium" or "no salt added."  Eat fresh foods.  Eat more vegetables,  fruits, and low-fat dairy products.  Choose whole grains. Look for the word "whole" as the first word in the ingredient list.  Choose fish and skinless chicken or [place] more often than red meat. Limit fish, poultry, and meat to 6 oz (170 g) each day.  Limit sweets, desserts, sugars, and sugary drinks.  Choose heart-healthy fats.  Limit cheese to 1 oz (28 g) per day.  Eat more home-cooked food and less restaurant, buffet, and fast food.  Limit fried foods.  Cook foods using methods other than frying.  Limit canned vegetables. If you do use them, rinse them well to decrease the sodium.  When eating at a restaurant, ask that your food be prepared with less salt, or no salt if possible. WHAT FOODS CAN I EAT? Seek help from a dietitian for individual calorie needs. Grains Whole grain or whole wheat bread. Brown rice. Whole grain or whole wheat pasta. Quinoa, bulgur, and whole grain cereals. Low-sodium cereals. Corn or whole wheat flour tortillas. Whole grain cornbread. Whole grain crackers. Low-sodium crackers. Vegetables Fresh or frozen vegetables (raw, steamed, roasted, or grilled). Low-sodium or reduced-sodium tomato and vegetable juices. Low-sodium or reduced-sodium tomato sauce and paste. Low-sodium or reduced-sodium canned vegetables.  Fruits All fresh, canned (in natural juice), or frozen fruits. Meat and Other Protein Products Ground beef (85% or leaner), grass-fed beef, or beef trimmed of fat. Skinless chicken or [place]. Ground chicken or [place]. Pork trimmed of fat. All  fish and seafood. Eggs. Dried beans, peas, or lentils. Unsalted nuts and seeds. Unsalted canned beans. Dairy Low-fat dairy products, such as skim or 1% milk, 2% or reduced-fat cheeses, low-fat ricotta or cottage cheese, or plain low-fat yogurt. Low-sodium or reduced-sodium cheeses. Fats and Oils Tub margarines without trans fats. Light or reduced-fat mayonnaise and salad dressings (reduced sodium). Avocado.  Safflower, olive, or canola oils. Natural peanut or almond butter. Other Unsalted popcorn and pretzels. The items listed above may not be a complete list of recommended foods or beverages. Contact your dietitian for more options. WHAT FOODS ARE NOT RECOMMENDED? Grains White bread. White pasta. White rice. Refined cornbread. Bagels and croissants. Crackers that contain trans fat. Vegetables Creamed or fried vegetables. Vegetables in a cheese sauce. Regular canned vegetables. Regular canned tomato sauce and paste. Regular tomato and vegetable juices. Fruits Dried fruits. Canned fruit in light or heavy syrup. Fruit juice. Meat and Other Protein Products Fatty cuts of meat. Ribs, chicken wings, bacon, sausage, bologna, salami, chitterlings, fatback, hot dogs, bratwurst, and packaged luncheon meats. Salted nuts and seeds. Canned beans with salt. Dairy Whole or 2% milk, cream, half-and-half, and cream cheese. Whole-fat or sweetened yogurt. Full-fat cheeses or blue cheese. Nondairy creamers and whipped toppings. Processed cheese, cheese spreads, or cheese curds. Condiments Onion and garlic salt, seasoned salt, table salt, and sea salt. Canned and packaged gravies. Worcestershire sauce. Tartar sauce. Barbecue sauce. Teriyaki sauce. Soy sauce, including reduced sodium. Steak sauce. Fish sauce. Oyster sauce. Cocktail sauce. Horseradish. Ketchup and mustard. Meat flavorings and tenderizers. Bouillon cubes. Hot sauce. Tabasco sauce. Marinades. Taco seasonings. Relishes. Fats and Oils Butter, stick margarine, lard, shortening, ghee, and bacon fat. Coconut, palm kernel, or palm oils. Regular salad dressings. Other Pickles and olives. Salted popcorn and pretzels. The items listed above may not be a complete list of foods and beverages to avoid. Contact your dietitian for more information. WHERE CAN I FIND MORE INFORMATION? National Heart, Lung, and Blood Institute:  travelstabloid.com Document Released: 07/28/2011 Document Revised: 12/23/2013 Document Reviewed: 06/12/2013 [facility] Patient Information 2015 [place], [place]. This information is not intended to replace advice given to you by your health care provider. Make sure you discuss any questions you have with your health care provider.

## 2014-03-18 NOTE — Assessment & Plan Note (Signed)
Poorly controlled with weight increase by 20 lbs. Labwork reviewed and unremarkable. 2.8% 10 year risk of heart attack or stroke. Discussed this is likely higher given family history of heart disease. Suggested adding aspirin for now and will continue cholesterol discussion yearly.

## 2014-03-18 NOTE — Assessment & Plan Note (Signed)
Knees better. Only in outer ear and some in ear canal now.

## 2014-03-18 NOTE — Progress Notes (Signed)
Pre visit review using our clinic review tool, if applicable. No additional management support is needed unless otherwise documented below in the visit note. 

## 2015-07-04 ENCOUNTER — Encounter: Payer: Self-pay | Admitting: Emergency Medicine

## 2015-07-04 ENCOUNTER — Emergency Department
Admission: EM | Admit: 2015-07-04 | Discharge: 2015-07-04 | Disposition: A | Discharge status: Home / Self Care | Payer: 59 | Source: Home / Self Care | Attending: Family Medicine | Admitting: Family Medicine

## 2015-07-04 DIAGNOSIS — S0501XA Injury of conjunctiva and corneal abrasion without foreign body, right eye, initial encounter: Secondary | ICD-10-CM | POA: Diagnosis not present

## 2015-07-04 MED ORDER — POLYMYXIN B-TRIMETHOPRIM 10000-0.1 UNIT/ML-% OP SOLN: 1.0000 [drp] | OPHTHALMIC | Status: DC

## 2015-07-04 NOTE — Discharge Instructions (Signed)
May take Ibuprofen 200mg , 4 tabs every 8 hours with food as needed for pain.   Corneal Abrasion The cornea is the clear covering at the front and center of the eye. When looking at the colored portion of the eye (iris), you are looking through the cornea. This very thin tissue is made up of many layers. The surface layer is a single layer of cells (corneal epithelium) and is one of the most sensitive tissues in the body. If a scratch or injury causes the corneal epithelium to come off, it is called a corneal abrasion. If the injury extends to the tissues below the epithelium, the condition is called a corneal ulcer. CAUSES   Scratches.  Trauma.  Foreign body in the eye. Some people have recurrences of abrasions in the area of the original injury even after it has healed (recurrent erosion syndrome). Recurrent erosion syndrome generally improves and goes away with time. SYMPTOMS   Eye pain.  Difficulty or inability to keep the injured eye open.  The eye becomes very sensitive to light.  Recurrent erosions tend to happen suddenly, first thing in the morning, usually after waking up and opening the eye. DIAGNOSIS  Your health care provider can diagnose a corneal abrasion during an eye exam. Dye is usually placed in the eye using a drop or a small paper strip moistened by your tears. When the eye is examined with a special light, the abrasion shows up clearly because of the dye. TREATMENT   Small abrasions may be treated with antibiotic drops or ointment alone. If the abrasion becomes infected and spreads to the deeper tissues of the cornea, a corneal ulcer can result. This is serious because it can cause corneal scarring. Corneal scars interfere with light passing through the cornea and cause a loss of vision in the involved eye. HOME CARE INSTRUCTIONS  Use medicine or ointment as directed. Only take over-the-counter or prescription medicines for pain, discomfort, or fever as directed by  your health care provider.  If your health care provider has given you a follow-up appointment, it is very important to keep that appointment. Not keeping the appointment could result in a severe eye infection or permanent loss of vision. If there is any problem keeping the appointment, let your health care provider know. SEEK MEDICAL CARE IF:   You have pain, light sensitivity, and a scratchy feeling in one eye or both eyes.  Any kind of discharge develops from the eye after treatment or if the lids stick together in the morning.  You have the same symptoms in the morning as you did with the original abrasion days, weeks, or months after the abrasion healed.   This information is not intended to replace advice given to you by your health care provider. Make sure you discuss any questions you have with your health care provider.   Document Released: 08/05/2000 Document Revised: 04/29/2015 Document Reviewed: 04/15/2013 Elsevier Interactive Patient Education [employer].

## 2015-07-04 NOTE — ED Provider Notes (Signed)
CSN: [id number]     Arrival date & time 07/04/15  1153 History   First MD Initiated Contact with Patient 07/04/15 1227     Chief Complaint  Patient presents with  . Eye Pain      HPI Comments: While in woods yesterday, patient felt something in his right eye.  He has had a persistent foreign body sensation.  Patient is a 49 y.o. male presenting with eye pain. The history is provided by the patient.  Eye Pain This is a new problem. The current episode started yesterday. The problem occurs constantly. The problem has not changed since onset.Exacerbated by: blinking. Treatments tried: eye lavage. The treatment provided mild relief.    Past Medical History  Diagnosis Date  . History of hypertension   . Psoriasis     Danella Sensing, MD dermatology  . Hypertension    Past Surgical History  Procedure Laterality Date  . Lasik Bilateral    Family History  Problem Relation Age of Onset  . Cancer Mother     lung, smoker  . Heart disease Father     30, mid to late 34s first bypass. grandfather died at 11.   . Diabetes Father   . Cancer Sister     lung, smoker   Social History  Substance Use Topics  . Smoking status: Never Smoker   . Smokeless tobacco: None  . Alcohol Use: Yes     Comment: 6-10 beers    Review of Systems  Eyes: Positive for pain and redness. Negative for photophobia, discharge, itching and visual disturbance.  All other systems reviewed and are negative.   Allergies  Doxycycline hyclate  Home Medications   Prior to Admission medications   Medication Sig Start Date End Date Taking? Authorizing Provider  aspirin 81 MG tablet Take 81 mg by mouth daily.    Historical Provider, MD  ibuprofen (ADVIL,MOTRIN) 200 MG tablet Take 200-400 mg by mouth every 6 (six) hours as needed.    Historical Provider, MD  naproxen sodium (ALEVE) 220 MG tablet Take 220 mg by mouth as needed.    Historical Provider, MD  trimethoprim-polymyxin b (POLYTRIM) ophthalmic solution Place 1  drop into the right eye every 4 (four) hours. 07/04/15   Kandra Nicolas, MD   Meds Ordered and Administered this Visit  Medications - No data to display  BP 166/99 mmHg  Pulse 72  Temp(Src) 97.8 F (36.6 C) (Oral)  Ht 5\' 10"  (1.778 m)  Wt 230 lb (104.327 kg)  BMI 33.00 kg/m2  SpO2 98% No data found.   Physical Exam  Constitutional: He appears well-developed and well-nourished. No distress.  HENT:  Head: Atraumatic.  Right Ear: External ear normal.  Left Ear: External ear normal.  Nose: Nose normal.  Mouth/Throat: Oropharynx is clear and moist.  Eyes: Conjunctivae, EOM and lids are normal. Pupils are equal, round, and reactive to light. Lids are everted and swept, no foreign bodies found. Right eye exhibits no chemosis, no discharge, no exudate and no hordeolum. No foreign body present in the right eye. Left eye exhibits no discharge.    Fluorescein to the right eye reveals a superficial corneal abrasion 1.65mm by 45mm as noted on diagram.  No photophobia.      Nursing note and vitals reviewed.   ED Course  Procedures none   Visual Acuity Review  Right Eye Distance: 20/25 Left Eye Distance: 20/20 Bilateral Distance:  (lasik surgery)    MDM   1. Right corneal  abrasion, initial encounter    Begin Polytrim ophthalmic suspension May take Ibuprofen 200mg , 4 tabs every 8 hours with food as needed for pain. Followup with ophthalmologist if not improved 3 days, or if symptoms worsen.    Kandra Nicolas, MD 07/09/15 [phone]

## 2015-07-04 NOTE — ED Notes (Signed)
Was out in woods yesterday and something got into his right eye; he does not know if foreign body is still present; it would be twig/leaf matter.

## 2015-10-01 ENCOUNTER — Encounter (HOSPITAL_COMMUNITY): Payer: Self-pay

## 2015-10-01 ENCOUNTER — Encounter (HOSPITAL_COMMUNITY)
Admission: RE | Admit: 2015-10-01 | Discharge: 2015-10-01 | Disposition: A | Discharge status: Home / Self Care | Payer: 59 | Source: Ambulatory Visit | Attending: General Surgery | Admitting: General Surgery

## 2015-10-01 ENCOUNTER — Ambulatory Visit: Payer: Self-pay | Admitting: General Surgery

## 2015-10-01 DIAGNOSIS — Z01812 Encounter for preprocedural laboratory examination: Secondary | ICD-10-CM | POA: Diagnosis not present

## 2015-10-01 DIAGNOSIS — I10 Essential (primary) hypertension: Secondary | ICD-10-CM | POA: Insufficient documentation

## 2015-10-01 DIAGNOSIS — Z01818 Encounter for other preprocedural examination: Secondary | ICD-10-CM | POA: Insufficient documentation

## 2015-10-01 DIAGNOSIS — K429 Umbilical hernia without obstruction or gangrene: Secondary | ICD-10-CM | POA: Insufficient documentation

## 2015-10-01 DIAGNOSIS — L409 Psoriasis, unspecified: Secondary | ICD-10-CM | POA: Diagnosis not present

## 2015-10-01 LAB — BASIC METABOLIC PANEL
Anion gap: 11 (ref 5–15)
BUN: 22 mg/dL — ABNORMAL HIGH (ref 6–20)
CO2: 25 mmol/L (ref 22–32)
Calcium: 9.7 mg/dL (ref 8.9–10.3)
Chloride: 107 mmol/L (ref 101–111)
Creatinine, Ser: 1.72 mg/dL — ABNORMAL HIGH (ref 0.61–1.24)
GFR calc Af Amer: 52 mL/min — ABNORMAL LOW (ref >60)
GFR calc non Af Amer: 45 mL/min — ABNORMAL LOW (ref >60)
Glucose, Bld: 99 mg/dL (ref 65–99)
Potassium: 4.6 mmol/L (ref 3.5–5.1)
Sodium: 143 mmol/L (ref 135–145)

## 2015-10-01 LAB — CBC
HCT: 45.2 % (ref 39.0–52.0)
Hemoglobin: 16 g/dL (ref 13.0–17.0)
MCH: 31.9 pg (ref 26.0–34.0)
MCHC: 35.4 g/dL (ref 30.0–36.0)
MCV: 90.2 fL (ref 78.0–100.0)
Platelets: 174 10*3/uL (ref 150–400)
RBC: 5.01 MIL/uL (ref 4.22–5.81)
RDW: 12.3 % (ref 11.5–15.5)
WBC: 6.2 10*3/uL (ref 4.0–10.5)

## 2015-10-01 NOTE — Progress Notes (Signed)
Denies heart history, cardiologist, Stress test, echo or heart cath   Primary care doctor is Dr Ulanda Edison

## 2015-10-01 NOTE — Pre-Procedure Instructions (Signed)
    Jermaine Walls.  10/01/2015      [facility] - [place], [place] - [address] [place] [address] Phone: [phone] Fax: [phone]    Your procedure is scheduled on Monday February 13th  Report to [facility] Admitting at 5:30am.  Call this number if you have problems the morning of surgery:  [phone]   Remember:  Do not eat food or drink liquids after midnight.   Take these medicines the morning of surgery with A SIP OF WATER: none  Stop taking all aspirin or aspirin containing products, Ibuprofen such as advil or motrin, Naproxin (Aleve) or other NSaids, herbal medications, fish oil and vitamins    Do not wear jewelry, make-up or nail polish.  Do not wear lotions, powders, or perfumes.  You may not wear deodorant.  Do not shave 48 hours prior to surgery.  Men may shave face and neck.  Do not bring valuables to the hospital.   [facility] is not responsible for any belongings or valuables.  Contacts, dentures or bridgework may not be worn into surgery.  Leave your suitcase in the car.  After surgery it may be brought to your room.  For patients admitted to the hospital, discharge time will be determined by your treatment team.  Patients discharged the day of surgery will not be allowed to drive home.    Special instructions:  See attached  Please read over the following fact sheets that you were given. Pain Booklet, Coughing and Deep Breathing and Surgical Site Infection Prevention

## 2015-10-01 NOTE — H&P (Signed)
History of Present Illness Ralene Ok MD; 09/24/2015 10:38 AM) The patient is a 50 year old male who presents with an umbilical hernia. Patient is a 50 year old male with a primary umbilical hernia. Patient states this is been there for several weeks. He states his gotten bigger and become more painful. 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 The patient is very active.   Allergies Elbert Ewings, CMA; 09/24/2015 10:25 AM) Doxycycline Hyclate *Tetracyclines** Rash.  Medication History Elbert Ewings, CMA; 09/24/2015 10:25 AM) No Current Medications Medications Reconciled  Vitals Elbert Ewings CMA; 09/24/2015 10:25 AM) 09/24/2015 10:25 AM Weight: 244 lb Height: 71in Body Surface Area: 2.29 m Body Mass Index: 34.03 kg/m  Temp.: 97.83F(Temporal)  Pulse: 64 (Regular)  BP: 142/84 (Sitting, Left Arm, Standard)       Physical Exam Ralene Ok MD; 09/24/2015 10:39 AM) General Mental Status-Alert. General Appearance-Consistent with stated age. Hydration-Well hydrated. Voice-Normal.  Head and Neck Head-normocephalic, atraumatic with no lesions or palpable masses. Trachea-midline. Thyroid Gland Characteristics - normal size and consistency.  Chest and Lung Exam Chest and lung exam reveals -quiet, even and easy respiratory effort with no use of accessory muscles and on auscultation, normal breath sounds, no adventitious sounds and normal vocal resonance. Inspection Chest Wall - Normal. Back - normal.  Cardiovascular Cardiovascular examination reveals -normal heart sounds, regular rate and rhythm with no murmurs and normal pedal pulses bilaterally.  Abdomen Inspection Skin - Scar - no surgical scars. Hernias - Umbilical hernia - Reducible(2 cm umbilical hernia). Palpation/Percussion Normal exam - Soft, Non Tender, No Rebound tenderness, No Rigidity (guarding) and No hepatosplenomegaly. Auscultation Normal exam - Bowel sounds  normal.    Assessment & Plan Ralene Ok MD; [id number] AM) UMBILICAL HERNIA WITHOUT OBSTRUCTION AND WITHOUT GANGRENE (K42.9) Impression: Patient is a 50 year old male with a primary umbilical hernia. 1. The patient will like to proceed to the operating room for laparoscopic umbilical hernia repair with mesh.  2. I discussed with the patient the signs and symptoms of incarceration and strangulation and the need to proceed to the ER should they occur.  3. I discussed with the patient the risks and benefits of the procedure to include but not limited to: Infection, bleeding, damage to surrounding structures, possible need for further surgery, possible nerve pain, and possible recurrence. The patient was understanding and wishes to proceed.

## 2015-10-02 DIAGNOSIS — R899 Unspecified abnormal finding in specimens from other organs, systems and tissues: Secondary | ICD-10-CM | POA: Diagnosis not present

## 2015-10-02 MED ORDER — CHLORHEXIDINE GLUCONATE 4 % EX LIQD: 1.0000 "application " | Freq: Once | CUTANEOUS | Status: DC

## 2015-10-02 MED ORDER — CEFAZOLIN SODIUM-DEXTROSE 2-3 GM-% IV SOLR
2.0000 g | INTRAVENOUS | Status: AC
  Administered 2015-10-05: 2 g via INTRAVENOUS
  Filled 2015-10-02: qty 50

## 2015-10-02 NOTE — Progress Notes (Signed)
Anesthesia Chart Review: Patient is a 50 year old male scheduled for laparoscopic umbilical hernia repair on 10/05/15 by Dr. Rosendo Gros.  History includes non-smoker, HTN, psoriasis (Dr. Danella Sensing), HTN (no longer on meds), esophageal dilation, Lasik. BMI is consistent with obesity. PCP is Dr. Yaakov Guthrie, first established in 2016. Office note and labs pending.  Meds include ibuprofen.   10/01/15 EKG: NSR.  Preoperative labs noted. BUN 22, Cr 1.72. Cr newly elevated since 2015 and when compared to 12/26/14 labs from Dr. Jacelyn Grip (Cr 1.01). No known history of CKD.  I spoke with Raquel Sarna at Dr. Rosendo Gros' office earlier today. She had notified Dr. Jodi Mourning office of newly elevated Cr, and patient was to be seen today with a repeat of his labs. They are suppose to let Dr. Rosendo Gros' staff know if he is cleared based on follow-up labs results. I was not given an update prior to 5PM today, so anesthesiologist will have to follow-up with Dr. Rosendo Gros and patient on the day of surgery to determine what is known about his clearance status. If patient was not given a copy of his labs or they are not viewable in My Chart then he may need an ISTAT8 or BMET on arrival. Will defer to his anesthesiologist.   George Hugh [facility] Short Stay Center/Anesthesiology Phone [phone] 10/02/2015 6:24 PM

## 2015-10-05 ENCOUNTER — Encounter (HOSPITAL_COMMUNITY)
Admission: RE | Disposition: A | Discharge status: Home / Self Care | Payer: Self-pay | Source: Ambulatory Visit | Attending: General Surgery

## 2015-10-05 ENCOUNTER — Encounter (HOSPITAL_COMMUNITY): Payer: Self-pay | Admitting: Surgery

## 2015-10-05 ENCOUNTER — Ambulatory Visit (HOSPITAL_COMMUNITY): Payer: 59 | Admitting: Anesthesiology

## 2015-10-05 ENCOUNTER — Ambulatory Visit (HOSPITAL_COMMUNITY)
Admission: RE | Admit: 2015-10-05 | Discharge: 2015-10-05 | Disposition: A | Discharge status: Home / Self Care | Payer: 59 | Source: Ambulatory Visit | Attending: General Surgery | Admitting: General Surgery

## 2015-10-05 ENCOUNTER — Ambulatory Visit (HOSPITAL_COMMUNITY): Payer: 59 | Admitting: Vascular Surgery

## 2015-10-05 DIAGNOSIS — I10 Essential (primary) hypertension: Secondary | ICD-10-CM | POA: Insufficient documentation

## 2015-10-05 DIAGNOSIS — K420 Umbilical hernia with obstruction, without gangrene: Secondary | ICD-10-CM | POA: Diagnosis not present

## 2015-10-05 DIAGNOSIS — K429 Umbilical hernia without obstruction or gangrene: Secondary | ICD-10-CM | POA: Diagnosis not present

## 2015-10-05 HISTORY — PX: UMBILICAL HERNIA REPAIR: SHX196

## 2015-10-05 HISTORY — PX: INSERTION OF MESH: SHX5868

## 2015-10-05 SURGERY — REPAIR, HERNIA, UMBILICAL, LAPAROSCOPIC
Anesthesia: General | Site: Abdomen

## 2015-10-05 MED ORDER — MIDAZOLAM HCL 2 MG/2ML IJ SOLN
INTRAMUSCULAR | Status: AC
  Filled 2015-10-05: qty 2

## 2015-10-05 MED ORDER — ROCURONIUM BROMIDE 100 MG/10ML IV SOLN
INTRAVENOUS | Status: DC | PRN
  Administered 2015-10-05: 10 mg via INTRAVENOUS
  Administered 2015-10-05: 50 mg via INTRAVENOUS

## 2015-10-05 MED ORDER — FENTANYL CITRATE (PF) 100 MCG/2ML IJ SOLN
INTRAMUSCULAR | Status: DC | PRN
  Administered 2015-10-05 (×4): 50 ug via INTRAVENOUS
  Administered 2015-10-05: 100 ug via INTRAVENOUS
  Administered 2015-10-05: 50 ug via INTRAVENOUS

## 2015-10-05 MED ORDER — ONDANSETRON HCL 4 MG/2ML IJ SOLN
INTRAMUSCULAR | Status: AC
  Filled 2015-10-05: qty 2

## 2015-10-05 MED ORDER — LIDOCAINE HCL (CARDIAC) 20 MG/ML IV SOLN
INTRAVENOUS | Status: DC | PRN
  Administered 2015-10-05: 40 mg via INTRAVENOUS

## 2015-10-05 MED ORDER — ROCURONIUM BROMIDE 50 MG/5ML IV SOLN
INTRAVENOUS | Status: AC
  Filled 2015-10-05: qty 1

## 2015-10-05 MED ORDER — PROPOFOL 10 MG/ML IV BOLUS
INTRAVENOUS | Status: AC
  Filled 2015-10-05: qty 20

## 2015-10-05 MED ORDER — FENTANYL CITRATE (PF) 100 MCG/2ML IJ SOLN
INTRAMUSCULAR | Status: AC
  Filled 2015-10-05: qty 2

## 2015-10-05 MED ORDER — FENTANYL CITRATE (PF) 250 MCG/5ML IJ SOLN
INTRAMUSCULAR | Status: AC
  Filled 2015-10-05: qty 5

## 2015-10-05 MED ORDER — SODIUM CHLORIDE 0.9 % IR SOLN
Status: DC | PRN
  Administered 2015-10-05: 1

## 2015-10-05 MED ORDER — LACTATED RINGERS IV SOLN
INTRAVENOUS | Status: DC | PRN
  Administered 2015-10-05: 07:00:00 via INTRAVENOUS

## 2015-10-05 MED ORDER — OXYCODONE HCL 5 MG PO TABS
5.0000 mg | ORAL_TABLET | Freq: Once | ORAL | Status: AC | PRN
  Administered 2015-10-05: 5 mg via ORAL

## 2015-10-05 MED ORDER — ONDANSETRON HCL 4 MG/2ML IJ SOLN: 4.0000 mg | Freq: Once | INTRAMUSCULAR | Status: DC | PRN

## 2015-10-05 MED ORDER — DEXAMETHASONE SODIUM PHOSPHATE 10 MG/ML IJ SOLN
INTRAMUSCULAR | Status: DC | PRN
  Administered 2015-10-05: 10 mg via INTRAVENOUS

## 2015-10-05 MED ORDER — OXYCODONE HCL 5 MG PO TABS
ORAL_TABLET | ORAL | Status: AC
  Filled 2015-10-05: qty 1

## 2015-10-05 MED ORDER — LIDOCAINE HCL (CARDIAC) 20 MG/ML IV SOLN
INTRAVENOUS | Status: AC
  Filled 2015-10-05: qty 5

## 2015-10-05 MED ORDER — 0.9 % SODIUM CHLORIDE (POUR BTL) OPTIME
TOPICAL | Status: DC | PRN
  Administered 2015-10-05: 1000 mL

## 2015-10-05 MED ORDER — PROPOFOL 10 MG/ML IV BOLUS
INTRAVENOUS | Status: DC | PRN
  Administered 2015-10-05: 200 mg via INTRAVENOUS

## 2015-10-05 MED ORDER — SODIUM CHLORIDE 0.9 % IJ SOLN
INTRAMUSCULAR | Status: AC
  Filled 2015-10-05: qty 10

## 2015-10-05 MED ORDER — FENTANYL CITRATE (PF) 100 MCG/2ML IJ SOLN
25.0000 ug | INTRAMUSCULAR | Status: DC | PRN
  Administered 2015-10-05 (×3): 50 ug via INTRAVENOUS

## 2015-10-05 MED ORDER — SUCCINYLCHOLINE CHLORIDE 20 MG/ML IJ SOLN
INTRAMUSCULAR | Status: AC
  Filled 2015-10-05: qty 1

## 2015-10-05 MED ORDER — MIDAZOLAM HCL 5 MG/5ML IJ SOLN
INTRAMUSCULAR | Status: DC | PRN
  Administered 2015-10-05: 2 mg via INTRAVENOUS

## 2015-10-05 MED ORDER — OXYCODONE HCL 5 MG PO TABS: 5.0000 mg | ORAL_TABLET | ORAL | Status: DC | PRN

## 2015-10-05 MED ORDER — OXYCODONE HCL 5 MG/5ML PO SOLN: 5.0000 mg | Freq: Once | ORAL | Status: AC | PRN

## 2015-10-05 MED ORDER — BUPIVACAINE HCL 0.25 % IJ SOLN
INTRAMUSCULAR | Status: DC | PRN
  Administered 2015-10-05: 10 mL

## 2015-10-05 MED ORDER — BUPIVACAINE LIPOSOME 1.3 % IJ SUSP
20.0000 mL | INTRAMUSCULAR | Status: AC
  Administered 2015-10-05: 20 mL
  Filled 2015-10-05: qty 20

## 2015-10-05 MED ORDER — FENTANYL CITRATE (PF) 100 MCG/2ML IJ SOLN
50.0000 ug | Freq: Once | INTRAMUSCULAR | Status: AC
  Administered 2015-10-05: 50 ug via INTRAVENOUS

## 2015-10-05 MED ORDER — BUPIVACAINE HCL (PF) 0.25 % IJ SOLN
INTRAMUSCULAR | Status: AC
  Filled 2015-10-05: qty 30

## 2015-10-05 MED ORDER — SUGAMMADEX SODIUM 200 MG/2ML IV SOLN
INTRAVENOUS | Status: DC | PRN
  Administered 2015-10-05: 200 mg via INTRAVENOUS

## 2015-10-05 MED ORDER — KETOROLAC TROMETHAMINE 30 MG/ML IJ SOLN
INTRAMUSCULAR | Status: AC
  Filled 2015-10-05: qty 1

## 2015-10-05 MED ORDER — ACETAMINOPHEN 10 MG/ML IV SOLN
INTRAVENOUS | Status: AC
  Administered 2015-10-05: 1000 mg
  Filled 2015-10-05: qty 100

## 2015-10-05 MED ORDER — SUGAMMADEX SODIUM 200 MG/2ML IV SOLN
INTRAVENOUS | Status: AC
  Filled 2015-10-05: qty 2

## 2015-10-05 MED ORDER — PHENYLEPHRINE 40 MCG/ML (10ML) SYRINGE FOR IV PUSH (FOR BLOOD PRESSURE SUPPORT)
PREFILLED_SYRINGE | INTRAVENOUS | Status: AC
  Filled 2015-10-05: qty 10

## 2015-10-05 MED ORDER — ONDANSETRON HCL 4 MG/2ML IJ SOLN
INTRAMUSCULAR | Status: DC | PRN
  Administered 2015-10-05: 4 mg via INTRAVENOUS

## 2015-10-05 MED ORDER — EPHEDRINE SULFATE 50 MG/ML IJ SOLN
INTRAMUSCULAR | Status: AC
  Filled 2015-10-05: qty 1

## 2015-10-05 SURGICAL SUPPLY — 53 items
APL SKNCLS STERI-STRIP NONHPOA (GAUZE/BANDAGES/DRESSINGS) ×1
APPLIER CLIP LOGIC TI 5 (MISCELLANEOUS) IMPLANT
APPLIER CLIP ROT 10 11.4 M/L (STAPLE)
APR CLP MED LRG 11.4X10 (STAPLE)
APR CLP MED LRG 33X5 (MISCELLANEOUS)
BENZOIN TINCTURE PRP APPL 2/3 (GAUZE/BANDAGES/DRESSINGS) ×2 IMPLANT
BLADE SURG ROTATE 9660 (MISCELLANEOUS) ×1 IMPLANT
CANISTER SUCTION 2500CC (MISCELLANEOUS) IMPLANT
CHLORAPREP W/TINT 26ML (MISCELLANEOUS) ×2 IMPLANT
CLIP APPLIE ROT 10 11.4 M/L (STAPLE) IMPLANT
CLSR STERI-STRIP ANTIMIC 1/2X4 (GAUZE/BANDAGES/DRESSINGS) ×1 IMPLANT
COVER SURGICAL LIGHT HANDLE (MISCELLANEOUS) ×2 IMPLANT
DECANTER SPIKE VIAL GLASS SM (MISCELLANEOUS) ×1 IMPLANT
DEVICE SECURE STRAP 25 ABSORB (INSTRUMENTS) ×2 IMPLANT
DEVICE TROCAR PUNCTURE CLOSURE (ENDOMECHANICALS) ×2 IMPLANT
ELECT REM PT RETURN 9FT ADLT (ELECTROSURGICAL) ×2
ELECTRODE REM PT RTRN 9FT ADLT (ELECTROSURGICAL) ×1 IMPLANT
GAUZE SPONGE 2X2 8PLY STRL LF (GAUZE/BANDAGES/DRESSINGS) IMPLANT
GAUZE SPONGE 4X4 12PLY STRL (GAUZE/BANDAGES/DRESSINGS) ×2 IMPLANT
GLOVE BIO SURGEON STRL SZ7.5 (GLOVE) ×2 IMPLANT
GOWN STRL REUS W/ TWL LRG LVL3 (GOWN DISPOSABLE) ×2 IMPLANT
GOWN STRL REUS W/ TWL XL LVL3 (GOWN DISPOSABLE) ×1 IMPLANT
GOWN STRL REUS W/TWL LRG LVL3 (GOWN DISPOSABLE) ×4
GOWN STRL REUS W/TWL XL LVL3 (GOWN DISPOSABLE) ×2
KIT BASIN OR (CUSTOM PROCEDURE TRAY) ×2 IMPLANT
KIT ROOM TURNOVER OR (KITS) ×2 IMPLANT
MARKER SKIN DUAL TIP RULER LAB (MISCELLANEOUS) ×2 IMPLANT
MESH VENTRALIGHT ST 4.5IN (Mesh General) ×1 IMPLANT
NDL HYPO 25GX1X1/2 BEV (NEEDLE) IMPLANT
NDL INSUFFLATION 14GA 120MM (NEEDLE) ×1 IMPLANT
NDL SPNL 22GX3.5 QUINCKE BK (NEEDLE) IMPLANT
NEEDLE HYPO 25GX1X1/2 BEV (NEEDLE) ×2 IMPLANT
NEEDLE INSUFFLATION 14GA 120MM (NEEDLE) ×2 IMPLANT
NEEDLE SPNL 22GX3.5 QUINCKE BK (NEEDLE) ×2 IMPLANT
NS IRRIG 1000ML POUR BTL (IV SOLUTION) ×2 IMPLANT
PAD ARMBOARD 7.5X6 YLW CONV (MISCELLANEOUS) ×4 IMPLANT
SCISSORS LAP 5X35 DISP (ENDOMECHANICALS) ×2 IMPLANT
SET IRRIG TUBING LAPAROSCOPIC (IRRIGATION / IRRIGATOR) ×1 IMPLANT
SLEEVE ENDOPATH XCEL 5M (ENDOMECHANICALS) ×2 IMPLANT
SPONGE GAUZE 2X2 STER 10/PKG (GAUZE/BANDAGES/DRESSINGS) ×1
STRIP CLOSURE SKIN 1/2X4 (GAUZE/BANDAGES/DRESSINGS) ×2 IMPLANT
SUT CHROMIC 2 0 SH (SUTURE) ×2 IMPLANT
SUT MNCRL AB 4-0 PS2 18 (SUTURE) ×2 IMPLANT
SUT PROLENE 2 0 KS (SUTURE) ×1 IMPLANT
SYRINGE 3CC LL L/F (MISCELLANEOUS) ×1 IMPLANT
TOWEL OR 17X24 6PK STRL BLUE (TOWEL DISPOSABLE) ×2 IMPLANT
TOWEL OR 17X26 10 PK STRL BLUE (TOWEL DISPOSABLE) ×2 IMPLANT
TRAY FOLEY CATH 14FR (SET/KITS/TRAYS/PACK) IMPLANT
TRAY LAPAROSCOPIC MC (CUSTOM PROCEDURE TRAY) ×2 IMPLANT
TROCAR XCEL BLUNT TIP 100MML (ENDOMECHANICALS) IMPLANT
TROCAR XCEL NON-BLD 11X100MML (ENDOMECHANICALS) IMPLANT
TROCAR XCEL NON-BLD 5MMX100MML (ENDOMECHANICALS) ×3 IMPLANT
TUBING INSUFFLATION (TUBING) ×2 IMPLANT

## 2015-10-05 NOTE — Transfer of Care (Signed)
Immediate Anesthesia Transfer of Care Note  Patient: Jermaine Walls.  Procedure(s) Performed: Procedure(s): LAPAROSCOPIC UMBILICAL HERNIA REPAIR WITH MESH (N/A) INSERTION OF MESH (N/A)  Patient Location: PACU  Anesthesia Type:General  Level of Consciousness: awake, alert , oriented and patient cooperative  Airway & Oxygen Therapy: Patient Spontanous Breathing and Patient connected to nasal cannula oxygen  Post-op Assessment: Report given to RN, Post -op Vital signs reviewed and stable and Patient moving all extremities X 4  Post vital signs: Reviewed and stable  Last Vitals:  Filed Vitals:   10/05/15 0549  BP: 153/94  Pulse: 72  Temp: 36.3 C  Resp: 20    Complications: No apparent anesthesia complications

## 2015-10-05 NOTE — Progress Notes (Signed)
Patient stated he had a office visit with his PCP on Friday and his creatinine was 1.03. Patient stated he would inform Dr. Linna Caprice of this.

## 2015-10-05 NOTE — Anesthesia Preprocedure Evaluation (Addendum)
Anesthesia Evaluation  Patient identified by MRN, date of birth, ID band Patient awake    Reviewed: Allergy & Precautions, NPO status , Patient's Chart, lab work & pertinent test results  Airway Mallampati: II  TM Distance: >3 FB Neck ROM: Full    Dental  (+) Teeth Intact, Dental Advisory Given   Pulmonary    breath sounds clear to auscultation       Cardiovascular hypertension,  Rhythm:Regular Rate:Normal     Neuro/Psych    GI/Hepatic   Endo/Other    Renal/GU      Musculoskeletal   Abdominal   Peds  Hematology   Anesthesia Other Findings   Reproductive/Obstetrics                            Anesthesia Physical Anesthesia Plan  ASA: II  Anesthesia Plan: General   Post-op Pain Management:    Induction: Intravenous  Airway Management Planned: Oral ETT  Additional Equipment:   Intra-op Plan:   Post-operative Plan: Extubation in OR  Informed Consent: I have reviewed the patients History and Physical, chart, labs and discussed the procedure including the risks, benefits and alternatives for the proposed anesthesia with the patient or authorized representative who has indicated his/her understanding and acceptance.   Dental advisory given  Plan Discussed with: CRNA, Anesthesiologist and Surgeon  Anesthesia Plan Comments:         Anesthesia Quick Evaluation

## 2015-10-05 NOTE — H&P (View-Only) (Signed)
History of Present Illness Ralene Ok MD; 09/24/2015 10:38 AM) The patient is a 50 year old male who presents with an umbilical hernia. Patient is a 50 year old male with a primary umbilical hernia. Patient states this is been there for several weeks. He states his gotten bigger and become more painful. 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   Allergies Elbert Ewings, CMA; 09/24/2015 10:25 AM) Doxycycline Hyclate *Tetracyclines** Rash.  Medication History Elbert Ewings, CMA; 09/24/2015 10:25 AM) No Current Medications Medications Reconciled  Vitals Elbert Ewings CMA; 09/24/2015 10:25 AM) 09/24/2015 10:25 AM Weight: 244 lb Height: 71in Body Surface Area: 2.29 m Body Mass Index: 34.03 kg/m  Temp.: 97.49F(Temporal)  Pulse: 64 (Regular)  BP: 142/84 (Sitting, Left Arm, Standard)       Physical Exam Ralene Ok MD; 09/24/2015 10:39 AM) General Mental Status-Alert. General Appearance-Consistent with stated age. Hydration-Well hydrated. Voice-Normal.  Head and Neck Head-normocephalic, atraumatic with no lesions or palpable masses. Trachea-midline. Thyroid Gland Characteristics - normal size and consistency.  Chest and Lung Exam Chest and lung exam reveals -quiet, even and easy respiratory effort with no use of accessory muscles and on auscultation, normal breath sounds, no adventitious sounds and normal vocal resonance. Inspection Chest Wall - Normal. Back - normal.  Cardiovascular Cardiovascular examination reveals -normal heart sounds, regular rate and rhythm with no murmurs and normal pedal pulses bilaterally.  Abdomen Inspection Skin - Scar - no surgical scars. Hernias - Umbilical hernia - Reducible(2 cm umbilical hernia). Palpation/Percussion Normal exam - Soft, Non Tender, No Rebound tenderness, No Rigidity (guarding) and No hepatosplenomegaly. Auscultation Normal exam - Bowel sounds  normal.    Assessment & Plan Ralene Ok MD; [id number] AM) UMBILICAL HERNIA WITHOUT OBSTRUCTION AND WITHOUT GANGRENE (K42.9) Impression: Patient is a 50 year old male with a primary umbilical hernia. 1. The patient will like to proceed to the operating room for laparoscopic umbilical hernia repair with mesh.  2. I discussed with the patient the signs and symptoms of incarceration and strangulation and the need to proceed to the ER should they occur.  3. I discussed with the patient the risks and benefits of the procedure to include but not limited to: Infection, bleeding, damage to surrounding structures, possible need for further surgery, possible nerve pain, and possible recurrence. The patient was understanding and wishes to proceed.

## 2015-10-05 NOTE — Anesthesia Procedure Notes (Signed)
Procedure Name: Intubation Date/Time: 10/05/2015 7:33 AM Performed by: Carney Living Pre-anesthesia Checklist: Patient identified, Emergency Drugs available, Suction available, Patient being monitored and Timeout performed Patient Re-evaluated:Patient Re-evaluated prior to inductionOxygen Delivery Method: Circle system utilized Preoxygenation: Pre-oxygenation with 100% oxygen Intubation Type: IV induction Ventilation: Mask ventilation without difficulty Laryngoscope Size: Mac and 4 Grade View: Grade I Tube type: Oral Tube size: 7.5 mm Number of attempts: 1 Airway Equipment and Method: Stylet Placement Confirmation: ETT inserted through vocal cords under direct vision,  positive ETCO2 and breath sounds checked- equal and bilateral Secured at: 21 cm Tube secured with: Tape Dental Injury: Teeth and Oropharynx as per pre-operative assessment

## 2015-10-05 NOTE — Interval H&P Note (Signed)
History and Physical Interval Note:  10/05/2015 6:29 AM  Jermaine Walls.  has presented today for surgery, with the diagnosis of Umbilical hernia  The various methods of treatment have been discussed with the patient and family. After consideration of risks, benefits and other options for treatment, the patient has consented to  Procedure(s): [name] (N/A) INSERTION OF MESH (N/A) as a surgical intervention .  The patient's history has been reviewed, patient examined, no change in status, stable for surgery.  I have reviewed the patient's chart and labs.  Questions were answered to the patient's satisfaction.     Rosario Jacks., Anne Hahn

## 2015-10-05 NOTE — Discharge Instructions (Signed)
[facility] _______[facility] ° °UMBILICAL HERNIA REPAIR: POST OP INSTRUCTIONS ° °Always review your discharge instruction sheet given to you by the facility where your surgery was performed. °IF YOU HAVE DISABILITY OR FAMILY LEAVE FORMS, YOU MUST BRING THEM TO THE OFFICE FOR PROCESSING.   °DO NOT GIVE THEM TO YOUR DOCTOR. ° °1. A  prescription for pain medication may be given to you upon discharge.  Take your pain medication as prescribed, if needed.  If narcotic pain medicine is not needed, then you may take acetaminophen (Tylenol) or ibuprofen (Advil) as needed. °2. Take your usually prescribed medications unless otherwise directed. °3. If you need a refill on your pain medication, please contact your pharmacy.  They will contact our office to request authorization. Prescriptions will not be filled after 5 pm or on week-ends. °4. You should follow a light diet the first 24 hours after arrival home, such as soup and crackers, etc.  Be sure to include lots of fluids daily.  Resume your normal diet the day after surgery. °5. Most patients will experience some swelling and bruising around the umbilicus or in the groin and scrotum.  Ice packs and reclining will help.  Swelling and bruising can take several days to resolve.  °6. It is common to experience some constipation if taking pain medication after surgery.  Increasing fluid intake and taking a stool softener (such as Colace) will usually help or prevent this problem from occurring.  A mild laxative (Milk of Magnesia or Miralax) should be taken according to package directions if there are no bowel movements after 48 hours. °7. Unless discharge instructions indicate otherwise, you may remove your bandages 24-48 hours after surgery, and you may shower at that time.  You may have steri-strips (small skin tapes) in place directly over the incision.  These strips should be left on the skin for 7-10 days.  If your surgeon used skin glue on the incision, you  may shower in 24 hours.  The glue will flake off over the next 2-3 weeks.  Any sutures or staples will be removed at the office during your follow-up visit. °8. ACTIVITIES:  You may resume regular (light) daily activities beginning the next day--such as daily self-care, walking, climbing stairs--gradually increasing activities as tolerated.  You may have sexual intercourse when it is comfortable.  Refrain from any heavy lifting or straining until approved by your doctor. °a. You may drive when you are no longer taking prescription pain medication, you can comfortably wear a seatbelt, and you can safely maneuver your car and apply brakes. °b. RETURN TO WORK:  __________________________________________________________ °9. You should see your doctor in the office for a follow-up appointment approximately 2-3 weeks after your surgery.  Make sure that you call for this appointment within a day or two after you arrive home to insure a convenient appointment time. °10. OTHER INSTRUCTIONS:  __________________________________________________________________________________________________________________________________________________________________________________________  °WHEN TO CALL YOUR DOCTOR: °1. Fever over 101.0 °2. Inability to urinate °3. Nausea and/or vomiting °4. Extreme swelling or bruising °5. Continued bleeding from incision. °6. Increased pain, redness, or drainage from the incision ° °The clinic staff is available to answer your questions during regular business hours.  Please don’t hesitate to call and ask to speak to one of the nurses for clinical concerns.  If you have a medical emergency, go to the nearest emergency room or call 911.  A surgeon from [facility] is always on call at the hospital ° ° °[address], [address], [place], [place]  [address] ? ° [address] [address], [place], [place]   [address] °[phone] ? [phone] ? FAX [phone] °Web site:  [email or web address] ° °

## 2015-10-05 NOTE — Op Note (Signed)
10/05/2015  8:30 AM  PATIENT:  Jermaine Walls.  50 y.o. male  PRE-OPERATIVE DIAGNOSIS:  Umbilical hernia  POST-OPERATIVE DIAGNOSIS:  Incarcerated Umbilical hernia  PROCEDURE:  Procedure(s): LAPAROSCOPIC UMBILICAL HERNIA REPAIR WITH MESH (N/A) INSERTION OF MESH (N/A)  SURGEON:  Surgeon(s) and Role:    * Ralene Ok, MD - Primary   ANESTHESIA:   local, regional and general  EBL:   15cc  BLOOD ADMINISTERED:none  DRAINS: none   LOCAL MEDICATIONS USED:  BUPIVICAINE  and OTHER experil  SPECIMEN:  No Specimen  DISPOSITION OF SPECIMEN:  N/A  COUNTS:  YES  TOURNIQUET:  * No tourniquets in log *  DICTATION: .Dragon Dictation  Details of the procedure:   After the patient was consented patient was taken back to the operating room patient was then placed in supine position bilateral SCDs in place.  The patient was prepped and draped in the usual sterile fashion. After antibiotics were confirmed a timeout was called and all facts were verified. The Veress needle technique was used to insuflate the abdomen at Palmer's point. The abdomen was insufflated to 14 mm mercury. Subsequently a 5 mm trocar was placed a camera inserted there was no injury to any intra-abdominal organs.    There was seen to be an incarcerated  1.5cm umbilcal hernia.  A second camera port was in placed into the left lower quadrant. There was some oozing from this port site.  At this the Falicform ligament was taken down with Bovie cautery maintaining hemostasis.  I proceeded to reduce the hernia contents and dissected the hernia sac from the abdominal wall.  Once the hernia was cleared away, a Bard Ventralight 11.4cm  mesh was inserted into the abdomen.  The mesh was secured circumferentially with am Securestrap tacker in a double crown fashion.  2-0 prolenes were used at 3:00, 6:00, 9:00, and 12:00 as transfascial sutures using a endoclose decive.    The omentum was brought over the area of the mesh. The  pneumoperitoneum was evacuated  & all trocars  were removed. The skin was reapproximated with 4-0  Monocryl sutures in a subcuticular fashion. The skin was dressed with Steri-Strips tape and gauze.  The patient was taken to the recovery room in stable condition.   PLAN OF CARE: Discharge to home after PACU  PATIENT DISPOSITION:  PACU - hemodynamically stable.   Delay start of Pharmacological VTE agent (>24hrs) due to surgical blood loss or risk of bleeding: not applicable

## 2015-10-05 NOTE — Anesthesia Postprocedure Evaluation (Signed)
Anesthesia Post Note  Patient: Jermaine Walls.  Procedure(s) Performed: Procedure(s) (LRB): LAPAROSCOPIC UMBILICAL HERNIA REPAIR WITH MESH (N/A) INSERTION OF MESH (N/A)  Patient location during evaluation: PACU Anesthesia Type: General Level of consciousness: awake, awake and alert and oriented Pain management: pain level controlled Vital Signs Assessment: post-procedure vital signs reviewed and stable Respiratory status: spontaneous breathing and nonlabored ventilation Cardiovascular status: blood pressure returned to baseline Anesthetic complications: no    Last Vitals:  Filed Vitals:   10/05/15 1100 10/05/15 1115  BP: 138/90 149/97  Pulse:  66  Temp:    Resp:      Last Pain:  Filed Vitals:   10/05/15 1127  PainSc: 3                  Danyele Smejkal COKER

## 2015-10-06 ENCOUNTER — Encounter (HOSPITAL_COMMUNITY): Payer: Self-pay | Admitting: General Surgery

## 2015-10-24 ENCOUNTER — Emergency Department (HOSPITAL_COMMUNITY): Payer: 59

## 2015-10-24 ENCOUNTER — Emergency Department (HOSPITAL_COMMUNITY)
Admission: EM | Admit: 2015-10-24 | Discharge: 2015-10-24 | Disposition: A | Discharge status: Home / Self Care | Payer: 59 | Attending: Emergency Medicine | Admitting: Emergency Medicine

## 2015-10-24 ENCOUNTER — Encounter (HOSPITAL_COMMUNITY): Payer: Self-pay | Admitting: Family Medicine

## 2015-10-24 DIAGNOSIS — M549 Dorsalgia, unspecified: Secondary | ICD-10-CM | POA: Diagnosis not present

## 2015-10-24 DIAGNOSIS — Z872 Personal history of diseases of the skin and subcutaneous tissue: Secondary | ICD-10-CM | POA: Insufficient documentation

## 2015-10-24 DIAGNOSIS — R112 Nausea with vomiting, unspecified: Secondary | ICD-10-CM | POA: Insufficient documentation

## 2015-10-24 DIAGNOSIS — R0981 Nasal congestion: Secondary | ICD-10-CM | POA: Diagnosis not present

## 2015-10-24 DIAGNOSIS — R1013 Epigastric pain: Secondary | ICD-10-CM | POA: Diagnosis not present

## 2015-10-24 DIAGNOSIS — R109 Unspecified abdominal pain: Secondary | ICD-10-CM | POA: Diagnosis not present

## 2015-10-24 DIAGNOSIS — I10 Essential (primary) hypertension: Secondary | ICD-10-CM | POA: Insufficient documentation

## 2015-10-24 LAB — COMPREHENSIVE METABOLIC PANEL
ALT: 29 U/L (ref 17–63)
AST: 26 U/L (ref 15–41)
Albumin: 4.1 g/dL (ref 3.5–5.0)
Alkaline Phosphatase: 72 U/L (ref 38–126)
Anion gap: 17 — ABNORMAL HIGH (ref 5–15)
BUN: 15 mg/dL (ref 6–20)
CO2: 23 mmol/L (ref 22–32)
Calcium: 9.5 mg/dL (ref 8.9–10.3)
Chloride: 103 mmol/L (ref 101–111)
Creatinine, Ser: 0.99 mg/dL (ref 0.61–1.24)
GFR calc Af Amer: >60 mL/min (ref >60)
GFR calc non Af Amer: >60 mL/min (ref >60)
Glucose, Bld: 125 mg/dL — ABNORMAL HIGH (ref 65–99)
Potassium: 4.3 mmol/L (ref 3.5–5.1)
Sodium: 143 mmol/L (ref 135–145)
Total Bilirubin: 0.6 mg/dL (ref 0.3–1.2)
Total Protein: 7.1 g/dL (ref 6.5–8.1)

## 2015-10-24 LAB — URINALYSIS, ROUTINE W REFLEX MICROSCOPIC
Bilirubin Urine: NEGATIVE
Glucose, UA: NEGATIVE mg/dL
Hgb urine dipstick: NEGATIVE
Ketones, ur: NEGATIVE mg/dL
Leukocytes, UA: NEGATIVE
Nitrite: NEGATIVE
Protein, ur: 30 mg/dL — AB
Specific Gravity, Urine: 1.028 (ref 1.005–1.030)
pH: 6 (ref 5.0–8.0)

## 2015-10-24 LAB — CBC
HCT: 45.2 % (ref 39.0–52.0)
Hemoglobin: 15.8 g/dL (ref 13.0–17.0)
MCH: 31 pg (ref 26.0–34.0)
MCHC: 35 g/dL (ref 30.0–36.0)
MCV: 88.8 fL (ref 78.0–100.0)
Platelets: 243 10*3/uL (ref 150–400)
RBC: 5.09 MIL/uL (ref 4.22–5.81)
RDW: 12.1 % (ref 11.5–15.5)
WBC: 11.5 10*3/uL — ABNORMAL HIGH (ref 4.0–10.5)

## 2015-10-24 LAB — URINE MICROSCOPIC-ADD ON
Bacteria, UA: NONE SEEN
RBC / HPF: NONE SEEN RBC/hpf (ref 0–5)
WBC, UA: NONE SEEN WBC/hpf (ref 0–5)

## 2015-10-24 LAB — LIPASE, BLOOD: Lipase: 28 U/L (ref 11–51)

## 2015-10-24 LAB — I-STAT TROPONIN, ED
Troponin i, poc: 0.01 ng/mL (ref 0.00–0.08)
Troponin i, poc: 0.01 ng/mL (ref 0.00–0.08)

## 2015-10-24 MED ORDER — SODIUM CHLORIDE 0.9 % IV SOLN: INTRAVENOUS | Status: DC

## 2015-10-24 MED ORDER — HYDROMORPHONE HCL 1 MG/ML IJ SOLN
1.0000 mg | Freq: Once | INTRAMUSCULAR | Status: AC
  Administered 2015-10-24: 1 mg via INTRAVENOUS
  Filled 2015-10-24: qty 1

## 2015-10-24 MED ORDER — HYDROMORPHONE HCL 1 MG/ML IJ SOLN
1.0000 mg | Freq: Once | INTRAMUSCULAR | Status: AC
Start: 2015-10-24 — End: 2015-10-24
  Administered 2015-10-24: 1 mg via INTRAVENOUS
  Filled 2015-10-24: qty 1

## 2015-10-24 MED ORDER — HYDROMORPHONE HCL 1 MG/ML IJ SOLN
INTRAMUSCULAR | Status: AC
  Filled 2015-10-24: qty 1

## 2015-10-24 MED ORDER — SODIUM CHLORIDE 0.9 % IV BOLUS (SEPSIS)
1000.0000 mL | Freq: Once | INTRAVENOUS | Status: AC
  Administered 2015-10-24: 1000 mL via INTRAVENOUS

## 2015-10-24 MED ORDER — IOHEXOL 300 MG/ML  SOLN
25.0000 mL | INTRAMUSCULAR | Status: AC
  Administered 2015-10-24 (×2): 25 mL via ORAL

## 2015-10-24 MED ORDER — ONDANSETRON HCL 4 MG/2ML IJ SOLN
4.0000 mg | Freq: Once | INTRAMUSCULAR | Status: AC
  Administered 2015-10-24: 4 mg via INTRAVENOUS
  Filled 2015-10-24: qty 2

## 2015-10-24 MED ORDER — HYDROMORPHONE HCL 1 MG/ML IJ SOLN
1.0000 mg | Freq: Once | INTRAMUSCULAR | Status: AC
  Administered 2015-10-24: 1 mg via INTRAVENOUS

## 2015-10-24 MED ORDER — HYDROCODONE-ACETAMINOPHEN 5-325 MG PO TABS: 1.0000 | ORAL_TABLET | Freq: Four times a day (QID) | ORAL | Status: DC | PRN

## 2015-10-24 MED ORDER — IOHEXOL 300 MG/ML  SOLN
100.0000 mL | Freq: Once | INTRAMUSCULAR | Status: AC | PRN
  Administered 2015-10-24: 100 mL via INTRAVENOUS

## 2015-10-24 MED ORDER — PROMETHAZINE HCL 25 MG PO TABS: 25.0000 mg | ORAL_TABLET | Freq: Four times a day (QID) | ORAL | Status: DC | PRN

## 2015-10-24 NOTE — ED Provider Notes (Signed)
CSN: [id number]     Arrival date & time 10/24/15  1608 History   First MD Initiated Contact with Patient 10/24/15 1635     Chief Complaint  Patient presents with  . Abdominal Pain  . Nausea     (Consider location/radiation/quality/duration/timing/severity/associated sxs/prior Treatment) Patient is a 50 y.o. male presenting with abdominal pain. The history is provided by the patient.  Abdominal Pain Associated symptoms: nausea and vomiting   Associated symptoms: no chest pain, no dysuria, no fever and no shortness of breath    patient with onset of nausea this morning. By noon he had epigastric abdominal pain that was 10 out of 10 radiating to his back. Associated with several episodes of nausea and vomiting. No fevers. No diarrhea. No blood in the vomit. Patient status post umbilical hernia repair on February 13. That had been healing well no problems up to this point. Patient describes the pain as a sharp ache.  Past Medical History  Diagnosis Date  . History of hypertension   . Psoriasis     Danella Sensing, MD dermatology  . Hypertension     treated in past. No longer on medications   Past Surgical History  Procedure Laterality Date  . Lasik Bilateral   . Esophogus stretching      as an 50 year old  . Umbilical hernia repair N/A 10/05/2015    Procedure: LAPAROSCOPIC UMBILICAL HERNIA REPAIR WITH MESH;  Surgeon: Ralene Ok, MD;  Location: [facility];  Service: General;  Laterality: N/A;  . Insertion of mesh N/A 10/05/2015    Procedure: INSERTION OF MESH;  Surgeon: Ralene Ok, MD;  Location: [facility];  Service: General;  Laterality: N/A;   Family History  Problem Relation Age of Onset  . Cancer Mother     lung, smoker  . Heart disease Father     32, mid to late 28s first bypass. grandfather died at 99.   . Diabetes Father   . Cancer Sister     lung, smoker   Social History  Substance Use Topics  . Smoking status: Never Smoker   . Smokeless tobacco: None  . Alcohol Use: 3.6  oz/week    6 Cans of beer per week     Comment: 6-10 beers    Review of Systems  Constitutional: Negative for fever.  HENT: Positive for congestion.   Eyes: Negative for visual disturbance.  Respiratory: Negative for shortness of breath.   Cardiovascular: Negative for chest pain.  Gastrointestinal: Positive for nausea, vomiting and abdominal pain.  Genitourinary: Negative for dysuria.  Musculoskeletal: Positive for back pain.  Skin: Negative for rash.  Neurological: Negative for headaches.  Hematological: Does not bruise/bleed easily.  Psychiatric/Behavioral: Negative for confusion.      Allergies  Doxycycline hyclate  Home Medications   Prior to Admission medications   Medication Sig Start Date End Date Taking? Authorizing Provider  HYDROcodone-acetaminophen (NORCO/VICODIN) 5-325 MG tablet Take 1-2 tablets by mouth every 6 (six) hours as needed for moderate pain. 10/24/15   Fredia Sorrow, MD  oxyCODONE (ROXICODONE) 5 MG immediate release tablet Take 1 tablet (5 mg total) by mouth every 4 (four) hours as needed for severe pain. Patient not taking: Reported on 10/24/2015 10/05/15   Ralene Ok, MD  promethazine (PHENERGAN) 25 MG tablet Take 1 tablet (25 mg total) by mouth every 6 (six) hours as needed for nausea or vomiting. 10/24/15   Fredia Sorrow, MD   BP 129/92 mmHg  Pulse 70  Temp(Src) 98.1 F (  36.7 C) (Oral)  Resp 20  SpO2 97% Physical Exam  Constitutional: He is oriented to person, place, and time. He appears well-developed and well-nourished. No distress.  HENT:  Head: Normocephalic and atraumatic.  Mouth/Throat: Oropharynx is clear and moist.  Eyes: Conjunctivae and EOM are normal. Pupils are equal, round, and reactive to light.  Neck: Normal range of motion. Neck supple.  Cardiovascular: Normal rate, regular rhythm and normal heart sounds.   No murmur heard. Pulmonary/Chest: Effort normal and breath sounds normal. No respiratory distress.  Abdominal: Soft.  Bowel sounds are normal. There is no tenderness.  Musculoskeletal: Normal range of motion.  Neurological: He is alert and oriented to person, place, and time. No cranial nerve deficit. He exhibits normal muscle tone. Coordination normal.  Skin: Skin is warm.  Nursing note and vitals reviewed.   ED Course  Procedures (including critical care time) Labs Review Labs Reviewed  COMPREHENSIVE METABOLIC PANEL - Abnormal; Notable for the following:    Glucose, Bld 125 (*)    Anion gap 17 (*)    All other components within normal limits  CBC - Abnormal; Notable for the following:    WBC 11.5 (*)    All other components within normal limits  URINALYSIS, ROUTINE W REFLEX MICROSCOPIC (NOT AT [facility]) - Abnormal; Notable for the following:    Protein, ur 30 (*)    All other components within normal limits  URINE MICROSCOPIC-ADD ON - Abnormal; Notable for the following:    Squamous Epithelial / LPF 0-5 (*)    All other components within normal limits  LIPASE, BLOOD  I-STAT TROPOININ, ED  Randolm Idol, ED    Imaging Review Dg Chest 2 View  10/24/2015  CLINICAL DATA:  Nausea and vomiting.  Epigastric pain EXAM: CHEST  2 VIEW COMPARISON:  None. FINDINGS: The heart size and mediastinal contours are within normal limits. Decreased lung volumes. Both lungs are clear. The visualized skeletal structures are unremarkable. IMPRESSION: No active cardiopulmonary disease. Electronically Signed   By: Kerby Moors M.D.   On: 10/24/2015 18:19   Ct Abdomen Pelvis W Contrast  10/24/2015  CLINICAL DATA:  Worsening abdominal pain and nausea and vomiting. Approximately 3 weeks status post laparoscopic repair of umbilical hernia. EXAM: CT ABDOMEN AND PELVIS WITH CONTRAST TECHNIQUE: Multidetector CT imaging of the abdomen and pelvis was performed using the standard protocol following bolus administration of intravenous contrast. CONTRAST:  160mL OMNIPAQUE IOHEXOL 300 MG/ML  SOLN COMPARISON:  None. FINDINGS: Lower  chest:  No acute findings. Hepatobiliary: No hepatic masses are identified. Tiny sub-cm cysts are noted in the anterior right lobe adjacent gallbladder fossa and the anterior lateral segment of the left lobe. Gallbladder is unremarkable. Pancreas: No mass, inflammatory changes, or other significant abnormality. Spleen: Within normal limits in size and appearance. Adrenals/Urinary Tract: No masses identified. No evidence of hydronephrosis. Stomach/Bowel: No evidence of obstruction, inflammatory process, or abnormal fluid collections. Normal appendix visualized. Vascular/Lymphatic: No pathologically enlarged lymph nodes. No evidence of abdominal aortic aneurysm. Reproductive: No mass or other significant abnormality. Other: Tiny amount of free fluid noted in dependent portion of pelvis. Musculoskeletal:  No suspicious bone lesions identified. IMPRESSION: No acute findings or other significant abnormality identified within the abdomen or pelvis. Electronically Signed   By: Earle Gell M.D.   On: 10/24/2015 21:05   I have personally reviewed and evaluated these images and lab results as part of my medical decision-making.   EKG Interpretation   Date/Time:  Saturday October 24 2015  16:21:51 EST Ventricular Rate:  98 PR Interval:  114 QRS Duration: 72 QT Interval:  342 QTC Calculation: 436 R Axis:   39 Text Interpretation:  Normal sinus rhythm Nonspecific T wave abnormality  Abnormal ECG Confirmed by Dorita Rowlands  MD, Rozetta Stumpp ([id number]) on 10/24/2015 4:46:04  PM      MDM   Final diagnoses:  Epigastric pain    Patient status post umbilical hernia surgery February 13. Patient felt fine until this morning. Developed epigastric abdominal pain at around noon. Was nausea in the morning prior to that. Pain pretty severe sharp. Does radiate to the back. Pain is worse is 10 out of 10 when patient presented here but he had pain medicine the pain was 2 out of 10.  Patient also seen by his surgeon Dr.  Rosendo Gros.  Workup here was done to rule out an acute cardiac event or acute intra-abdominal process. No acute findings. EKG without any acute cardiac changes. Troponins 23 hours apart were both negative. Chest x-ray negative for pneumonia pneumothorax or pulmonary edema. No free air. CT scan of the abdomen was no acute findings or any significant abnormality identified within the abdomen or pelvis.  Patient with improvement with pain medicine. Patient will be discharged home. Patient counseled that if symptoms persist ultrasound sometimes evaluates the gallbladder better or hiatus scan. Also peptic ulcer disease is not been ruled out. An upper endoscopy could be needed. It is also possible that this is related to a stomach bug. Patient will be treated with pain medicine and antinausea medicine.    Fredia Sorrow, MD 10/24/15 2253

## 2015-10-24 NOTE — Discharge Instructions (Signed)
Take pain medicine as needed and as directed. Take the Phenergan to help with the nausea and vomiting. Also can take the Zofran that you have at home. Return for any new or worse symptoms. Today's workup without any significant findings. However symptoms persist ultrasound of the gallbladder would be appropriate. Also consideration for hiatus scan of the gallbladder as a outpatient. And also consider for an upper endoscopy to rule out the peptic ulcer.

## 2015-10-24 NOTE — ED Notes (Signed)
Pt here for epigastric pain and nausea that started this am. sts bloated and distended. sts recent hernia surgery.

## 2015-10-24 NOTE — ED Notes (Signed)
Patient transported to X-ray 

## 2015-10-24 NOTE — ED Notes (Signed)
CT aware that pt has finished his contrast.

## 2016-05-16 DIAGNOSIS — R062 Wheezing: Secondary | ICD-10-CM | POA: Diagnosis not present

## 2016-05-16 DIAGNOSIS — J069 Acute upper respiratory infection, unspecified: Secondary | ICD-10-CM | POA: Diagnosis not present

## 2016-12-12 ENCOUNTER — Ambulatory Visit (INDEPENDENT_AMBULATORY_CARE_PROVIDER_SITE_OTHER): Payer: 59

## 2016-12-12 ENCOUNTER — Encounter: Payer: Self-pay | Admitting: Podiatry

## 2016-12-12 ENCOUNTER — Ambulatory Visit (INDEPENDENT_AMBULATORY_CARE_PROVIDER_SITE_OTHER): Payer: 59 | Admitting: Podiatry

## 2016-12-12 DIAGNOSIS — M79672 Pain in left foot: Secondary | ICD-10-CM

## 2016-12-12 DIAGNOSIS — M722 Plantar fascial fibromatosis: Secondary | ICD-10-CM | POA: Diagnosis not present

## 2016-12-12 MED ORDER — MELOXICAM 15 MG PO TABS
15.0000 mg | ORAL_TABLET | Freq: Every day | ORAL | 1 refills | Status: AC
Start: 2016-12-12 — End: 2017-01-11

## 2016-12-13 NOTE — Progress Notes (Signed)
   Subjective: Patient presents today for throbbing pain and tenderness in the left foot on the plantar and lateral aspects that has been present for approximately 3 months. Patient states that it hurts in the mornings with the first steps out of bed. Being on his feet for long periods of time increase the pain. Patient presents today for further treatment and evaluation  Objective: Physical Exam General: The patient is alert and oriented x3 in no acute distress.  Dermatology: Skin is warm, dry and supple bilateral lower extremities. Negative for open lesions or macerations bilateral.   Vascular: Dorsalis Pedis and Posterior Tibial pulses palpable bilateral.  Capillary fill time is immediate to all digits.  Neurological: Epicritic and protective threshold intact bilateral.   Musculoskeletal: Tenderness to palpation at the medial calcaneal tubercale and through the insertion of the plantar fascia of the left foot. All other joints range of motion within normal limits bilateral. Strength 5/5 in all groups bilateral.   Radiographic exam:   Normal osseous mineralization. Joint spaces preserved. No fracture/dislocation/boney destruction. Calcaneal spur present with mild thickening of plantar fascia left. No other soft tissue abnormalities or radiopaque foreign bodies.   Assessment: 1. Plantar fasciitis left foot 2. Pain in left foot  Plan of Care:   1. Patient evaluated. Xrays reviewed.   2. Injection of 0.5cc Celestone soluspan injected into the left plantar fascia.  3. Instructed patient regarding therapies and modalities at home to alleviate symptoms.  4. Rx for meloxicam 15mg  PO given to patient.  5. Plantar fascial band(s) dispensed. 6. Return to clinic in 4 weeks.    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 Edrick Kins, [address]                                        [place], [place] [address]                 Office [phone]  Fax [phone]

## 2016-12-18 MED ORDER — BETAMETHASONE SOD PHOS & ACET 6 (3-3) MG/ML IJ SUSP: 3.0000 mg | Freq: Once | INTRAMUSCULAR | Status: DC

## 2017-01-23 ENCOUNTER — Ambulatory Visit (INDEPENDENT_AMBULATORY_CARE_PROVIDER_SITE_OTHER): Payer: 59 | Admitting: Podiatry

## 2017-01-23 ENCOUNTER — Encounter: Payer: Self-pay | Admitting: Podiatry

## 2017-01-23 DIAGNOSIS — M722 Plantar fascial fibromatosis: Secondary | ICD-10-CM

## 2017-01-23 MED ORDER — METHYLPREDNISOLONE 4 MG PO TBPK: ORAL_TABLET | ORAL | 0 refills | Status: DC

## 2017-01-23 MED ORDER — DICLOFENAC SODIUM 75 MG PO TBEC: 75.0000 mg | DELAYED_RELEASE_TABLET | Freq: Two times a day (BID) | ORAL | 0 refills | Status: DC

## 2017-01-23 NOTE — Progress Notes (Signed)
   Subjective: Patient presents today for follow up evaluation of soreness and tenderness in the left foot. She states the pain returned about two weeks ago after being alleviated by the injections at previous visit. She states she could not tell a difference with the plantar fasciitis brace.   Objective: Physical Exam General: The patient is alert and oriented x3 in no acute distress.  Dermatology: Skin is warm, dry and supple bilateral lower extremities. Negative for open lesions or macerations bilateral.   Vascular: Dorsalis Pedis and Posterior Tibial pulses palpable bilateral.  Capillary fill time is immediate to all digits.  Neurological: Epicritic and protective threshold intact bilateral.   Musculoskeletal: Tenderness to palpation at the medial calcaneal tubercale and through the insertion of the plantar fascia of the left foot. All other joints range of motion within normal limits bilateral. Strength 5/5 in all groups bilateral.    Assessment: 1. Plantar fasciitis left foot 2. Pain in left foot  Plan of Care:   1. Patient evaluated. Xrays reviewed.   2. Injection of 0.5cc Celestone soluspan injected into the left plantar fascia.  3. Prescription for Medrol Dose Pak given to patient. 4. Rx for Diclofenac 75mg  PO given to patient.  5. Discontinue wearing plantar fascial braces since they did not help. 6. Pt scanned for custom orthotics today. 7. Return to clinic in 4 weeks.    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 Edrick Kins, [address]                                        [place], [place] [address]                Office [phone]  Fax [phone]

## 2017-02-11 MED ORDER — BETAMETHASONE SOD PHOS & ACET 6 (3-3) MG/ML IJ SUSP: 3.0000 mg | Freq: Once | INTRAMUSCULAR | Status: DC

## 2017-02-20 ENCOUNTER — Ambulatory Visit: Payer: 59 | Admitting: Podiatry

## 2017-04-14 ENCOUNTER — Encounter: Payer: Self-pay | Admitting: Podiatry

## 2017-05-21 IMAGING — DX DG CHEST 2V
2 series · 2 of 2 positions shown · non-contrast
Comparison: None.

CLINICAL DATA: Nausea and vomiting.  Epigastric pain

EXAM:
CHEST  2 VIEW

[chest pa]
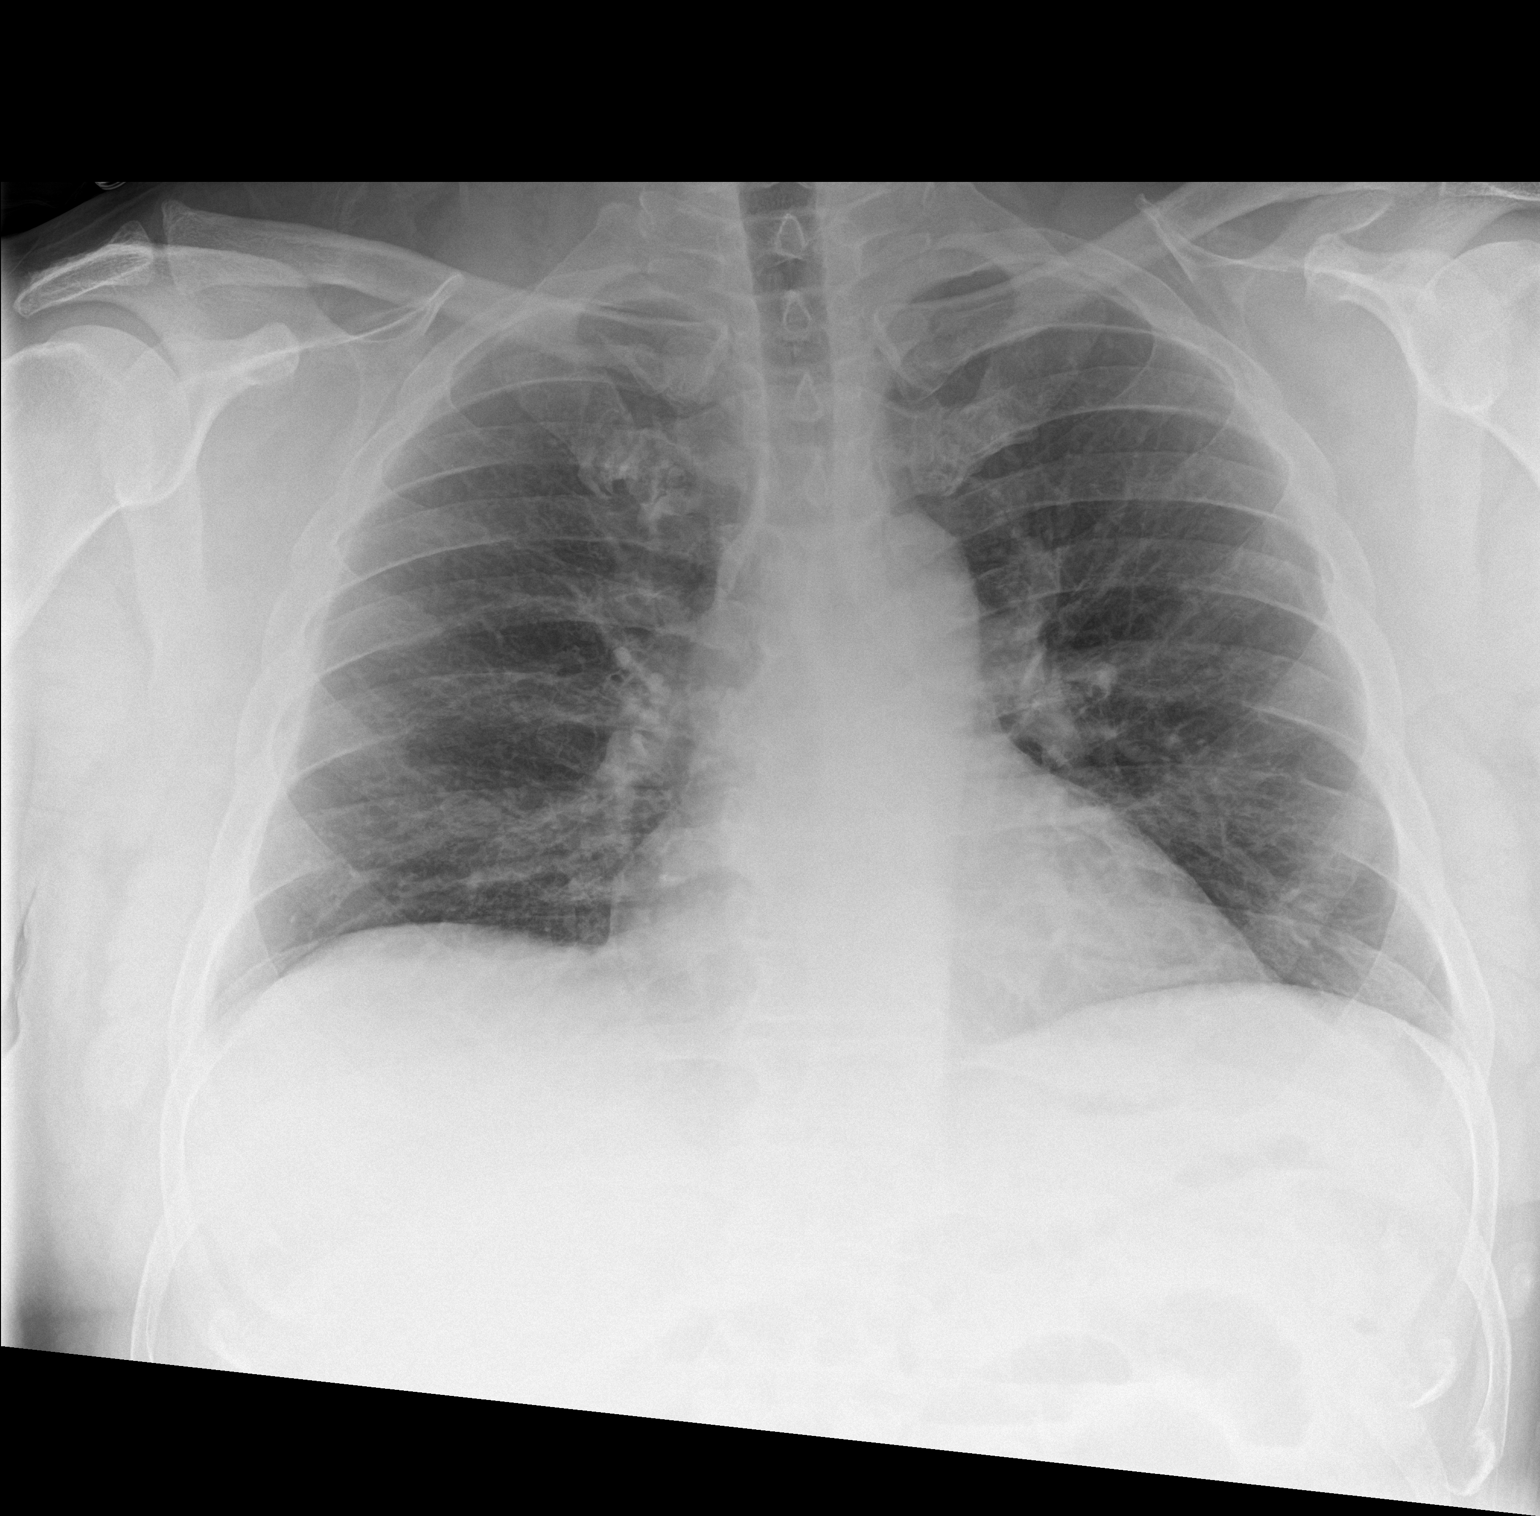

[chest lat]
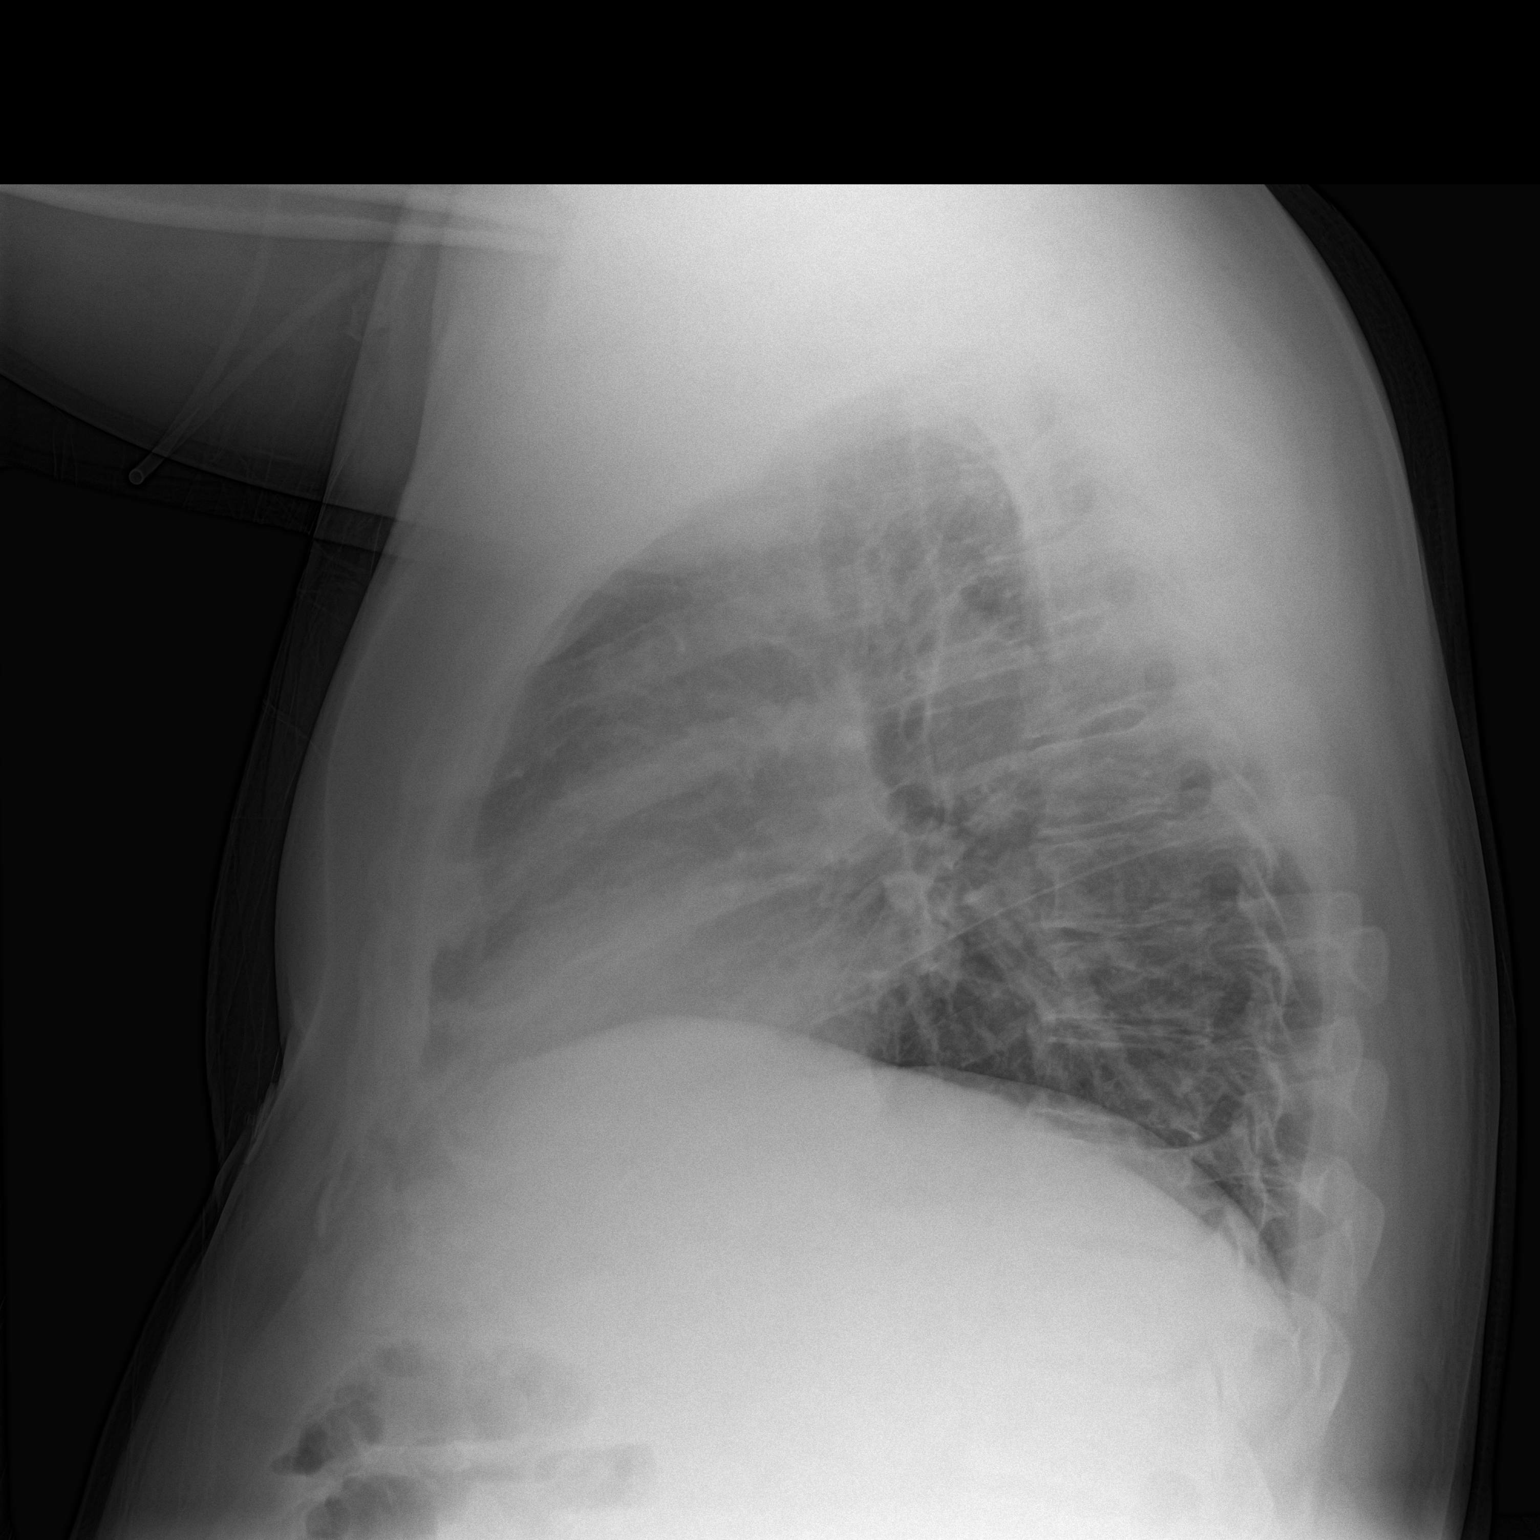

[2 of 2 positions shown; findings below may reference images not displayed]

FINDINGS: The heart size and mediastinal contours are within normal limits.
Decreased lung volumes. Both lungs are clear. The visualized
skeletal structures are unremarkable.
IMPRESSION: No active cardiopulmonary disease.

## 2017-10-27 ENCOUNTER — Encounter: Payer: Self-pay | Admitting: Internal Medicine

## 2017-11-14 ENCOUNTER — Ambulatory Visit (AMBULATORY_SURGERY_CENTER): Payer: Self-pay

## 2017-11-14 ENCOUNTER — Other Ambulatory Visit: Payer: Self-pay

## 2017-11-14 VITALS — Ht 70.0 in | Wt 243.2 lb

## 2017-11-14 DIAGNOSIS — Z1211 Encounter for screening for malignant neoplasm of colon: Secondary | ICD-10-CM

## 2017-11-14 MED ORDER — NA SULFATE-K SULFATE-MG SULF 17.5-3.13-1.6 GM/177ML PO SOLN: 1.0000 | Freq: Once | ORAL | 0 refills | Status: AC

## 2017-11-14 NOTE — Progress Notes (Signed)
Denies allergies to eggs or soy products. Denies complication of anesthesia or sedation. Denies use of weight loss medication. Denies use of O2.   Emmi instructions given for colonoscopy.  

## 2017-11-28 ENCOUNTER — Other Ambulatory Visit: Payer: Self-pay

## 2017-11-28 ENCOUNTER — Ambulatory Visit (AMBULATORY_SURGERY_CENTER): Payer: No Typology Code available for payment source | Admitting: Internal Medicine

## 2017-11-28 ENCOUNTER — Encounter: Payer: Self-pay | Admitting: Internal Medicine

## 2017-11-28 VITALS — BP 140/92 | HR 70 | Temp 99.6°F | Resp 19 | Ht 70.0 in | Wt 243.0 lb

## 2017-11-28 DIAGNOSIS — D122 Benign neoplasm of ascending colon: Secondary | ICD-10-CM

## 2017-11-28 DIAGNOSIS — D124 Benign neoplasm of descending colon: Secondary | ICD-10-CM | POA: Diagnosis not present

## 2017-11-28 DIAGNOSIS — Z1211 Encounter for screening for malignant neoplasm of colon: Secondary | ICD-10-CM

## 2017-11-28 MED ORDER — SODIUM CHLORIDE 0.9 % IV SOLN: 500.0000 mL | Freq: Once | INTRAVENOUS | Status: DC

## 2017-11-28 NOTE — Patient Instructions (Signed)
HANDOUTS GIVEN FOR POLYPS AND HEMORRHOIDS  YOU HAD AN ENDOSCOPIC PROCEDURE TODAY AT THE [facility]:   Refer to the procedure report that was given to you for any specific questions about what was found during the examination.  If the procedure report does not answer your questions, please call your gastroenterologist to clarify.  If you requested that your care partner not be given the details of your procedure findings, then the procedure report has been included in a sealed envelope for you to review at your convenience later.  YOU SHOULD EXPECT: Some feelings of bloating in the abdomen. Passage of more gas than usual.  Walking can help get rid of the air that was put into your GI tract during the procedure and reduce the bloating. If you had a lower endoscopy (such as a colonoscopy or flexible sigmoidoscopy) you may notice spotting of blood in your stool or on the toilet paper. If you underwent a bowel prep for your procedure, you may not have a normal bowel movement for a few days.  Please Note:  You might notice some irritation and congestion in your nose or some drainage.  This is from the oxygen used during your procedure.  There is no need for concern and it should clear up in a day or so.  SYMPTOMS TO REPORT IMMEDIATELY:   Following lower endoscopy (colonoscopy or flexible sigmoidoscopy):  Excessive amounts of blood in the stool  Significant tenderness or worsening of abdominal pains  Swelling of the abdomen that is new, acute  Fever of 100F or higher   For urgent or emergent issues, a gastroenterologist can be reached at any hour by calling [phone].   DIET:  We do recommend a small meal at first, but then you may proceed to your regular diet.  Drink plenty of fluids but you should avoid alcoholic beverages for 24 hours.  ACTIVITY:  You should plan to take it easy for the rest of today and you should NOT DRIVE or use heavy machinery until tomorrow (because of the  sedation medicines used during the test).    FOLLOW UP: Our staff will call the number listed on your records the next business day following your procedure to check on you and address any questions or concerns that you may have regarding the information given to you following your procedure. If we do not reach you, we will leave a message.  However, if you are feeling well and you are not experiencing any problems, there is no need to return our call.  We will assume that you have returned to your regular daily activities without incident.  If any biopsies were taken you will be contacted by phone or by letter within the next 1-3 weeks.  Please call us at [phone] if you have not heard about the biopsies in 3 weeks.    SIGNATURES/CONFIDENTIALITY: You and/or your care partner have signed paperwork which will be entered into your electronic medical record.  These signatures attest to the fact that that the information above on your After Visit Summary has been reviewed and is understood.  Full responsibility of the confidentiality of this discharge information lies with you and/or your care-partner. 

## 2017-11-28 NOTE — Progress Notes (Signed)
Called to room to assist during endoscopic procedure.  Patient ID and intended procedure confirmed with present staff. Received instructions for my participation in the procedure from the performing physician.  

## 2017-11-28 NOTE — Op Note (Signed)
[facility] Patient Name: Jermaine Walls Procedure Date: 11/28/2017 2:48 PM MRN: [id number] Endoscopist: Jerene Bears , MD Age: 52 Referring MD:  Date of Birth: [birth year] Gender: Male Account #: [id number] Procedure:                Colonoscopy Indications:              Screening for colorectal malignant neoplasm, This                            is the patient's first colonoscopy Medicines:                Monitored Anesthesia Care Procedure:                Pre-Anesthesia Assessment:                           - Prior to the procedure, a History and Physical                            was performed, and patient medications and                            allergies were reviewed. The patient's tolerance of                            previous anesthesia was also reviewed. The risks                            and benefits of the procedure and the sedation                            options and risks were discussed with the patient.                            All questions were answered, and informed consent                            was obtained. Prior Anticoagulants: The patient has                            taken no previous anticoagulant or antiplatelet                            agents. ASA Grade Assessment: II - A patient with                            mild systemic disease. After reviewing the risks                            and benefits, the patient was deemed in                            satisfactory condition to undergo the procedure.  After obtaining informed consent, the colonoscope                            was passed under direct vision. Throughout the                            procedure, the patient's blood pressure, pulse, and                            oxygen saturations were monitored continuously. The                            Colonoscope was introduced through the anus and                            advanced to the the cecum,  identified by                            appendiceal orifice and ileocecal valve. The                            colonoscopy was performed without difficulty. The                            patient tolerated the procedure well. The quality                            of the bowel preparation was good. The ileocecal                            valve, appendiceal orifice, and rectum were                            photographed. Scope In: 2:53:42 PM Scope Out: 3:05:44 PM Scope Withdrawal Time: 0 hours 10 minutes 58 seconds  Total Procedure Duration: 0 hours 12 minutes 2 seconds  Findings:                 The digital rectal exam was normal.                           A 3 mm polyp was found in the ascending colon. The                            polyp was sessile. The polyp was removed with a                            cold snare. Resection and retrieval were complete.                           A 5 mm polyp was found in the descending colon. The                            polyp was sessile. The polyp was  removed with a                            cold snare. Resection and retrieval were complete.                           Scattered small-mouthed diverticula were found in                            the sigmoid colon and descending colon.                           The retroflexed view of the distal rectum and anal                            verge was normal and showed no anal or rectal                            abnormalities. Complications:            No immediate complications. Estimated Blood Loss:     Estimated blood loss was minimal. Impression:               - One 3 mm polyp in the ascending colon, removed                            with a cold snare. Resected and retrieved.                           - One 5 mm polyp in the descending colon, removed                            with a cold snare. Resected and retrieved.                           - Diverticulosis in the sigmoid colon and in the                             descending colon.                           - The distal rectum and anal verge are normal on                            retroflexion view. Recommendation:           - Patient has a contact number available for                            emergencies. The signs and symptoms of potential                            delayed complications were discussed with the  patient. Return to normal activities tomorrow.                            Written discharge instructions were provided to the                            patient.                           - Resume previous diet.                           - Continue present medications.                           - Await pathology results.                           - Repeat colonoscopy is recommended. The                            colonoscopy date will be determined after pathology                            results from today's exam become available for                            review. Jerene Bears, MD 11/28/2017 3:08:02 PM This report has been signed electronically.

## 2017-11-28 NOTE — Progress Notes (Signed)
Report to PACU, RN, vss, BBS= Clear.  

## 2017-11-29 ENCOUNTER — Telehealth: Payer: Self-pay

## 2017-11-29 NOTE — Telephone Encounter (Signed)
  Follow up Call-  Call back number 11/28/2017  Post procedure Call Back phone  # [phone]  Permission to leave phone message Yes  Some recent data might be hidden     Patient questions:  Do you have a fever, pain , or abdominal swelling? No. Pain Score  0 *  Have you tolerated food without any problems? Yes.    Have you been able to return to your normal activities? Yes.    Do you have any questions about your discharge instructions: Diet   No. Medications  No. Follow up visit  No.  Do you have questions or concerns about your Care? No.  Actions: * If pain score is 4 or above: No action needed, pain <4.

## 2017-12-04 ENCOUNTER — Encounter: Payer: Self-pay | Admitting: Internal Medicine

## 2018-01-31 ENCOUNTER — Ambulatory Visit: Payer: No Typology Code available for payment source | Admitting: Neurology

## 2018-02-19 ENCOUNTER — Inpatient Hospital Stay (HOSPITAL_COMMUNITY)
Admission: EM | Admit: 2018-02-19 | Discharge: 2018-02-20 | DRG: 390 | Disposition: A | Discharge status: Home / Self Care | Payer: No Typology Code available for payment source | Attending: General Surgery | Admitting: General Surgery

## 2018-02-19 ENCOUNTER — Inpatient Hospital Stay (HOSPITAL_COMMUNITY): Payer: No Typology Code available for payment source

## 2018-02-19 ENCOUNTER — Encounter (HOSPITAL_COMMUNITY): Payer: Self-pay | Admitting: General Practice

## 2018-02-19 ENCOUNTER — Emergency Department (HOSPITAL_COMMUNITY): Payer: No Typology Code available for payment source

## 2018-02-19 ENCOUNTER — Other Ambulatory Visit: Payer: Self-pay

## 2018-02-19 DIAGNOSIS — I10 Essential (primary) hypertension: Secondary | ICD-10-CM | POA: Diagnosis present

## 2018-02-19 DIAGNOSIS — K56609 Unspecified intestinal obstruction, unspecified as to partial versus complete obstruction: Secondary | ICD-10-CM | POA: Diagnosis present

## 2018-02-19 DIAGNOSIS — K5730 Diverticulosis of large intestine without perforation or abscess without bleeding: Secondary | ICD-10-CM | POA: Diagnosis present

## 2018-02-19 DIAGNOSIS — K565 Intestinal adhesions [bands], unspecified as to partial versus complete obstruction: Secondary | ICD-10-CM | POA: Diagnosis present

## 2018-02-19 DIAGNOSIS — Z0189 Encounter for other specified special examinations: Secondary | ICD-10-CM

## 2018-02-19 HISTORY — DX: Unspecified intestinal obstruction, unspecified as to partial versus complete obstruction: K56.609

## 2018-02-19 HISTORY — DX: Pneumonia, unspecified organism: J18.9

## 2018-02-19 LAB — URINALYSIS, ROUTINE W REFLEX MICROSCOPIC
Bilirubin Urine: NEGATIVE
Glucose, UA: NEGATIVE mg/dL
Hgb urine dipstick: NEGATIVE
Ketones, ur: NEGATIVE mg/dL
Leukocytes, UA: NEGATIVE
Nitrite: NEGATIVE
Protein, ur: NEGATIVE mg/dL
Specific Gravity, Urine: 1.02 (ref 1.005–1.030)
pH: 7 (ref 5.0–8.0)

## 2018-02-19 LAB — CBC
HCT: 45.9 % (ref 39.0–52.0)
Hemoglobin: 15.8 g/dL (ref 13.0–17.0)
MCH: 31.2 pg (ref 26.0–34.0)
MCHC: 34.4 g/dL (ref 30.0–36.0)
MCV: 90.5 fL (ref 78.0–100.0)
Platelets: 205 10*3/uL (ref 150–400)
RBC: 5.07 MIL/uL (ref 4.22–5.81)
RDW: 12.2 % (ref 11.5–15.5)
WBC: 9.1 10*3/uL (ref 4.0–10.5)

## 2018-02-19 LAB — LIPASE, BLOOD: Lipase: 25 U/L (ref 11–51)

## 2018-02-19 LAB — BRAIN NATRIURETIC PEPTIDE: B Natriuretic Peptide: 12.8 pg/mL (ref 0.0–100.0)

## 2018-02-19 LAB — COMPREHENSIVE METABOLIC PANEL
ALT: 24 U/L (ref 0–44)
AST: 22 U/L (ref 15–41)
Albumin: 4.2 g/dL (ref 3.5–5.0)
Alkaline Phosphatase: 44 U/L (ref 38–126)
Anion gap: 12 (ref 5–15)
BUN: 12 mg/dL (ref 6–20)
CO2: 27 mmol/L (ref 22–32)
Calcium: 9.2 mg/dL (ref 8.9–10.3)
Chloride: 104 mmol/L (ref 98–111)
Creatinine, Ser: 1.1 mg/dL (ref 0.61–1.24)
GFR calc Af Amer: >60 mL/min (ref >60)
GFR calc non Af Amer: >60 mL/min (ref >60)
Glucose, Bld: 123 mg/dL — ABNORMAL HIGH (ref 70–99)
Potassium: 4.2 mmol/L (ref 3.5–5.1)
Sodium: 143 mmol/L (ref 135–145)
Total Bilirubin: 0.8 mg/dL (ref 0.3–1.2)
Total Protein: 6.8 g/dL (ref 6.5–8.1)

## 2018-02-19 MED ORDER — KETOROLAC TROMETHAMINE 15 MG/ML IJ SOLN
15.0000 mg | Freq: Four times a day (QID) | INTRAMUSCULAR | Status: DC | PRN
  Administered 2018-02-19 – 2018-02-20 (×2): 15 mg via INTRAVENOUS
  Filled 2018-02-19 (×3): qty 1

## 2018-02-19 MED ORDER — HYDROMORPHONE HCL 1 MG/ML IJ SOLN
0.5000 mg | Freq: Once | INTRAMUSCULAR | Status: AC
  Administered 2018-02-19: 0.5 mg via INTRAVENOUS
  Filled 2018-02-19: qty 1

## 2018-02-19 MED ORDER — ENOXAPARIN SODIUM 40 MG/0.4ML ~~LOC~~ SOLN
40.0000 mg | SUBCUTANEOUS | Status: DC
  Administered 2018-02-19: 40 mg via SUBCUTANEOUS
  Filled 2018-02-19: qty 0.4

## 2018-02-19 MED ORDER — ONDANSETRON HCL 4 MG/2ML IJ SOLN
4.0000 mg | Freq: Once | INTRAMUSCULAR | Status: AC
  Administered 2018-02-19: 4 mg via INTRAVENOUS
  Filled 2018-02-19: qty 2

## 2018-02-19 MED ORDER — DIATRIZOATE MEGLUMINE & SODIUM 66-10 % PO SOLN
90.0000 mL | Freq: Once | ORAL | Status: AC
  Administered 2018-02-19: 90 mL via NASOGASTRIC
  Filled 2018-02-19: qty 90

## 2018-02-19 MED ORDER — METOPROLOL TARTRATE 5 MG/5ML IV SOLN: 5.0000 mg | Freq: Four times a day (QID) | INTRAVENOUS | Status: DC | PRN

## 2018-02-19 MED ORDER — METHOCARBAMOL 1000 MG/10ML IJ SOLN
500.0000 mg | Freq: Three times a day (TID) | INTRAVENOUS | Status: DC | PRN
  Administered 2018-02-20: 500 mg via INTRAVENOUS
  Filled 2018-02-19 (×2): qty 5

## 2018-02-19 MED ORDER — PROCHLORPERAZINE MALEATE 10 MG PO TABS
10.0000 mg | ORAL_TABLET | Freq: Four times a day (QID) | ORAL | Status: DC | PRN
  Filled 2018-02-19: qty 1

## 2018-02-19 MED ORDER — IOHEXOL 300 MG/ML  SOLN
100.0000 mL | Freq: Once | INTRAMUSCULAR | Status: AC | PRN
  Administered 2018-02-19: 100 mL via INTRAVENOUS

## 2018-02-19 MED ORDER — HYDROMORPHONE HCL 1 MG/ML IJ SOLN
1.0000 mg | Freq: Once | INTRAMUSCULAR | Status: AC
  Administered 2018-02-19: 1 mg via INTRAVENOUS
  Filled 2018-02-19: qty 1

## 2018-02-19 MED ORDER — HYDROMORPHONE HCL 1 MG/ML IJ SOLN
1.0000 mg | INTRAMUSCULAR | Status: DC | PRN
  Administered 2018-02-19 – 2018-02-20 (×6): 1 mg via INTRAVENOUS
  Filled 2018-02-19 (×6): qty 1

## 2018-02-19 MED ORDER — MENTHOL 3 MG MT LOZG
1.0000 | LOZENGE | OROMUCOSAL | Status: DC | PRN
  Administered 2018-02-19: 3 mg via ORAL
  Filled 2018-02-19: qty 9

## 2018-02-19 MED ORDER — SODIUM CHLORIDE 0.9 % IV SOLN
INTRAVENOUS | Status: DC
  Administered 2018-02-19 – 2018-02-20 (×2): via INTRAVENOUS

## 2018-02-19 MED ORDER — SODIUM CHLORIDE 0.9 % IV BOLUS
1000.0000 mL | Freq: Once | INTRAVENOUS | Status: AC
  Administered 2018-02-19: 1000 mL via INTRAVENOUS

## 2018-02-19 MED ORDER — ONDANSETRON HCL 4 MG/2ML IJ SOLN: 4.0000 mg | Freq: Four times a day (QID) | INTRAMUSCULAR | Status: DC | PRN

## 2018-02-19 MED ORDER — PROCHLORPERAZINE EDISYLATE 10 MG/2ML IJ SOLN: 5.0000 mg | Freq: Four times a day (QID) | INTRAMUSCULAR | Status: DC | PRN

## 2018-02-19 MED ORDER — ONDANSETRON 4 MG PO TBDP: 4.0000 mg | ORAL_TABLET | Freq: Four times a day (QID) | ORAL | Status: DC | PRN

## 2018-02-19 NOTE — ED Notes (Signed)
Pt going to CT SCAN

## 2018-02-19 NOTE — ED Provider Notes (Signed)
[facility] EMERGENCY DEPARTMENT Provider Note   CSN: [id number] Arrival date & time: 02/19/18  1244     History   Chief Complaint Chief Complaint  Patient presents with  . Abdominal Pain    HPI Jermaine Walls. is a 52 y.o. male.  Patient started with abdominal discomfort at 2 AM today with nausea and vomiting.  The history is provided by the patient.  Abdominal Pain   This is a new problem. The current episode started 6 to 12 hours ago. The problem occurs constantly. The problem has not changed since onset.The pain is associated with an unknown factor. The pain is located in the epigastric region. The quality of the pain is aching. The pain is at a severity of 5/10. The pain is moderate. Associated symptoms include nausea. Pertinent negatives include anorexia, diarrhea, frequency, hematuria and headaches. Nothing aggravates the symptoms. Nothing relieves the symptoms. Past workup does not include GI consult. His past medical history does not include PUD.    Past Medical History:  Diagnosis Date  . History of hypertension   . Hypertension    treated in past. No longer on medications  . Psoriasis    Danella Sensing, MD dermatology    Patient Active Problem List   Diagnosis Date Noted  . Psoriasis 09/30/2011  . HYPERTENSION 01/22/2007    Past Surgical History:  Procedure Laterality Date  . esophogus stretching     as an 52 year old  . INSERTION OF MESH N/A 10/05/2015   Procedure: INSERTION OF MESH;  Surgeon: Ralene Ok, MD;  Location: [facility];  Service: General;  Laterality: N/A;  . LASIK Bilateral   . UMBILICAL HERNIA REPAIR N/A 10/05/2015   Procedure: LAPAROSCOPIC UMBILICAL HERNIA REPAIR WITH MESH;  Surgeon: Ralene Ok, MD;  Location: [facility];  Service: General;  Laterality: N/A;        Home Medications    Prior to Admission medications   Not on File    Family History Family History  Problem Relation Age of Onset  . Cancer Mother          lung, smoker  . Heart disease Father        14, mid to late 54s first bypass. grandfather died at 83.   . Diabetes Father   . Cancer Sister        lung, smoker  . Colon cancer Neg Hx   . Esophageal cancer Neg Hx   . Liver cancer Neg Hx   . Pancreatic cancer Neg Hx   . Rectal cancer Neg Hx   . Stomach cancer Neg Hx     Social History Social History   Tobacco Use  . Smoking status: Never Smoker  . Smokeless tobacco: Never Used  Substance Use Topics  . Alcohol use: Yes    Alcohol/week: 3.6 oz    Types: 6 Cans of beer per week    Comment: 6-10 beers  . Drug use: No     Allergies   Doxycycline hyclate   Review of Systems Review of Systems  Constitutional: Negative for appetite change and fatigue.  HENT: Negative for congestion, ear discharge and sinus pressure.   Eyes: Negative for discharge.  Respiratory: Negative for cough.   Cardiovascular: Negative for chest pain.  Gastrointestinal: Positive for abdominal pain and nausea. Negative for anorexia and diarrhea.  Genitourinary: Negative for frequency and hematuria.  Musculoskeletal: Negative for back pain.  Skin: Negative for rash.  Neurological: Negative for seizures and  headaches.  Psychiatric/Behavioral: Negative for hallucinations.     Physical Exam Updated Vital Signs BP (!) 154/97   Pulse 83   Temp 98.2 F (36.8 C) (Oral)   Resp 18   Ht 5\' 10"  (1.778 m)   Wt 108.9 kg (240 lb)   SpO2 100%   BMI 34.44 kg/m   Physical Exam  Constitutional: He is oriented to person, place, and time. He appears well-developed.  HENT:  Head: Normocephalic.  Eyes: Conjunctivae and EOM are normal. No scleral icterus.  Neck: Neck supple. No thyromegaly present.  Cardiovascular: Normal rate and regular rhythm. Exam reveals no gallop and no friction rub.  No murmur heard. Pulmonary/Chest: No stridor. He has no wheezes. He has no rales. He exhibits no tenderness.  Abdominal: He exhibits no distension. There is  tenderness. There is no rebound.  Tender epigastric  Musculoskeletal: Normal range of motion. He exhibits no edema.  Lymphadenopathy:    He has no cervical adenopathy.  Neurological: He is oriented to person, place, and time. He exhibits normal muscle tone. Coordination normal.  Skin: No rash noted. No erythema.  Psychiatric: He has a normal mood and affect. His behavior is normal.     ED Treatments / Results  Labs (all labs ordered are listed, but only abnormal results are displayed) Labs Reviewed  URINALYSIS, ROUTINE W REFLEX MICROSCOPIC - Abnormal; Notable for the following components:      Result Value   APPearance HAZY (*)    All other components within normal limits  COMPREHENSIVE METABOLIC PANEL - Abnormal; Notable for the following components:   Glucose, Bld 123 (*)    All other components within normal limits  CBC  BRAIN NATRIURETIC PEPTIDE  LIPASE, BLOOD    EKG None  Radiology Ct Abdomen Pelvis W Contrast  Result Date: 02/19/2018 CLINICAL DATA:  52 year old male with acute abdominal and pelvic pain today. EXAM: CT ABDOMEN AND PELVIS WITH CONTRAST TECHNIQUE: Multidetector CT imaging of the abdomen and pelvis was performed using the standard protocol following bolus administration of intravenous contrast. CONTRAST:  126mL OMNIPAQUE IOHEXOL 300 MG/ML  SOLN COMPARISON:  10/24/2015 and prior exams FINDINGS: Lower chest: No acute abnormality. Hepatobiliary: The liver and gallbladder are unremarkable except for a small LEFT hepatic cyst. No biliary dilatation. Pancreas: Unremarkable Spleen: Unremarkable Adrenals/Urinary Tract: The kidneys, adrenal glands and bladder are unremarkable. Stomach/Bowel: There are dilated loops of fluid-filled small bowel within the LEFT abdomen and mildly distended small bowel loops containing stool-like material within the LOWER and RIGHT abdomen. No discrete transition point is identified. Colonic diverticulosis noted without evidence of  diverticulitis. No definite bowel wall thickening identified. The appendix is normal. Vascular/Lymphatic: Aortic atherosclerosis. No enlarged abdominal or pelvic lymph nodes. Reproductive: Prostate is unremarkable. Other: No ascites, pneumoperitoneum or abscess. Small bilateral inguinal hernias containing fat are present. Musculoskeletal: No acute or suspicious bony abnormality. IMPRESSION: 1. Distended loops of small bowel without discrete transition point. Suspect small bowel obstruction-possibly low-grade. No ascites or pneumoperitoneum. 2.  Aortic Atherosclerosis (ICD10-I70.0). Electronically Signed   By: Margarette Canada M.D.   On: 02/19/2018 16:28    Procedures Procedures (including critical care time)  Medications Ordered in ED Medications  HYDROmorphone (DILAUDID) injection 1 mg (has no administration in time range)  ondansetron (ZOFRAN) injection 4 mg (has no administration in time range)  sodium chloride 0.9 % bolus 1,000 mL (has no administration in time range)  sodium chloride 0.9 % bolus 1,000 mL (1,000 mLs Intravenous New Bag/Given 02/19/18 1338)  ondansetron (  ZOFRAN) injection 4 mg (4 mg Intravenous Given 02/19/18 1338)  HYDROmorphone (DILAUDID) injection 0.5 mg (0.5 mg Intravenous Given 02/19/18 1339)  HYDROmorphone (DILAUDID) injection 0.5 mg (0.5 mg Intravenous Given 02/19/18 1525)  iohexol (OMNIPAQUE) 300 MG/ML solution 100 mL (100 mLs Intravenous Contrast Given 02/19/18 1548)     Initial Impression / Assessment and Plan / ED Course  I have reviewed the triage vital signs and the nursing notes.  Pertinent labs & imaging results that were available during my care of the patient were reviewed by me and considered in my medical decision making (see chart for details).     Patient with possible early small bowel obstruction.  General surgery will see patient  Final Clinical Impressions(s) / ED Diagnoses   Final diagnoses:  SBO (small bowel obstruction) [facility])    ED Discharge Orders      None       Milton Ferguson, MD 02/19/18 1654

## 2018-02-19 NOTE — ED Notes (Signed)
Attempted to give report to the floor nurse ;nurse not available at this time to take report ; will call back in 10 min

## 2018-02-19 NOTE — ED Notes (Signed)
Spoke with Ovid Curd , Pharmacist ; ok to give another 1 mg of dilaudid right now even though it hasn't been 2 hours since he last received a dose;

## 2018-02-19 NOTE — ED Triage Notes (Signed)
Pt c/o abd pain /n/v that began around 2 am today;denies any diarrhea ; pt also c/o mid back pain that began couple hours after the abd pain began ; denies CP or sob ; PT received 100 mg  of fentanyl and 4 mg of zofran prior to arrival

## 2018-02-19 NOTE — H&P (Signed)
Jermaine Walls. is an 52 y.o. male.   Chief Complaint: abd pain n/v HPI: 68 yof with history of lvh with mesh 2017 who presents with n/v and upper abd pain since 2 am. No flatus.  bm normal.  Not getting better at home so came to er. No relief.  No prior history. Has prior csc recently.  No fever.    Past Medical History:  Diagnosis Date  . History of hypertension   . Hypertension    treated in past. No longer on medications  . Psoriasis    Danella Sensing, MD dermatology    Past Surgical History:  Procedure Laterality Date  . esophogus stretching     as an 52 year old  . INSERTION OF MESH N/A 10/05/2015   Procedure: INSERTION OF MESH;  Surgeon: Ralene Ok, MD;  Location: [facility];  Service: General;  Laterality: N/A;  . LASIK Bilateral   . UMBILICAL HERNIA REPAIR N/A 10/05/2015   Procedure: LAPAROSCOPIC UMBILICAL HERNIA REPAIR WITH MESH;  Surgeon: Ralene Ok, MD;  Location: [facility];  Service: General;  Laterality: N/A;    Family History  Problem Relation Age of Onset  . Cancer Mother        lung, smoker  . Heart disease Father        35, mid to late 69s first bypass. grandfather died at 35.   . Diabetes Father   . Cancer Sister        lung, smoker  . Colon cancer Neg Hx   . Esophageal cancer Neg Hx   . Liver cancer Neg Hx   . Pancreatic cancer Neg Hx   . Rectal cancer Neg Hx   . Stomach cancer Neg Hx    Social History:  reports that he has never smoked. He has never used smokeless tobacco. He reports that he drinks about 3.6 oz of alcohol per week. He reports that he does not use drugs.  Allergies:  Allergies  Allergen Reactions  . Doxycycline Hyclate Itching and Rash   meds none   Results for orders placed or performed during the hospital encounter of 02/19/18 (from the past 48 hour(s))  CBC     Status: None   Collection Time: 02/19/18  1:14 PM  Result Value Ref Range   WBC 9.1 4.0 - 10.5 K/uL   RBC 5.07 4.22 - 5.81 MIL/uL   Hemoglobin 15.8 13.0 - 17.0  g/dL   HCT 45.9 39.0 - 52.0 %   MCV 90.5 78.0 - 100.0 fL   MCH 31.2 26.0 - 34.0 pg   MCHC 34.4 30.0 - 36.0 g/dL   RDW 12.2 11.5 - 15.5 %   Platelets 205 150 - 400 K/uL    Comment: Performed at [facility] [address] [place], [place] [address]  Brain natriuretic peptide     Status: None   Collection Time: 02/19/18  1:14 PM  Result Value Ref Range   B Natriuretic Peptide 12.8 0.0 - 100.0 pg/mL    Comment: Performed at [facility] [address] [place], [place] [address]  Urinalysis, Routine w reflex microscopic     Status: Abnormal   Collection Time: 02/19/18  1:53 PM  Result Value Ref Range   Color, Urine YELLOW YELLOW   APPearance HAZY (A) CLEAR   Specific Gravity, Urine 1.020 1.005 - 1.030   pH 7.0 5.0 - 8.0   Glucose, UA NEGATIVE NEGATIVE mg/dL   Hgb urine dipstick NEGATIVE NEGATIVE  Bilirubin Urine NEGATIVE NEGATIVE   Ketones, ur NEGATIVE NEGATIVE mg/dL   Protein, ur NEGATIVE NEGATIVE mg/dL   Nitrite NEGATIVE NEGATIVE   Leukocytes, UA NEGATIVE NEGATIVE    Comment: Performed at [facility] [address] [place], [place] [address]  Comprehensive metabolic panel     Status: Abnormal   Collection Time: 02/19/18  1:54 PM  Result Value Ref Range   Sodium 143 135 - 145 mmol/L   Potassium 4.2 3.5 - 5.1 mmol/L   Chloride 104 98 - 111 mmol/L    Comment: Please note change in reference range.   CO2 27 22 - 32 mmol/L   Glucose, Bld 123 (H) 70 - 99 mg/dL    Comment: Please note change in reference range.   BUN 12 6 - 20 mg/dL    Comment: Please note change in reference range.   Creatinine, Ser 1.10 0.61 - 1.24 mg/dL   Calcium 9.2 8.9 - 10.3 mg/dL   Total Protein 6.8 6.5 - 8.1 g/dL   Albumin 4.2 3.5 - 5.0 g/dL   AST 22 15 - 41 U/L   ALT 24 0 - 44 U/L    Comment: Please note change in reference range.   Alkaline Phosphatase 44 38 - 126 U/L   Total Bilirubin 0.8 0.3 - 1.2 mg/dL   GFR calc non Af Amer >60 >60 mL/min   GFR calc Af Amer >60 >60  mL/min    Comment: (NOTE) The eGFR has been calculated using the CKD EPI equation. This calculation has not been validated in all clinical situations. eGFR's persistently <60 mL/min signify possible Chronic Kidney Disease.    Anion gap 12 5 - 15    Comment: Performed at [facility] [address] [place], [place] [address]  Lipase, blood     Status: None   Collection Time: 02/19/18  1:54 PM  Result Value Ref Range   Lipase 25 11 - 51 U/L    Comment: Performed at [facility] [address] [place], [place] [address]   Ct Abdomen Pelvis W Contrast  Result Date: 02/19/2018 CLINICAL DATA:  52 year old male with acute abdominal and pelvic pain today. EXAM: CT ABDOMEN AND PELVIS WITH CONTRAST TECHNIQUE: Multidetector CT imaging of the abdomen and pelvis was performed using the standard protocol following bolus administration of intravenous contrast. CONTRAST:  179m OMNIPAQUE IOHEXOL 300 MG/ML  SOLN COMPARISON:  10/24/2015 and prior exams FINDINGS: Lower chest: No acute abnormality. Hepatobiliary: The liver and gallbladder are unremarkable except for a small LEFT hepatic cyst. No biliary dilatation. Pancreas: Unremarkable Spleen: Unremarkable Adrenals/Urinary Tract: The kidneys, adrenal glands and bladder are unremarkable. Stomach/Bowel: There are dilated loops of fluid-filled small bowel within the LEFT abdomen and mildly distended small bowel loops containing stool-like material within the LOWER and RIGHT abdomen. No discrete transition point is identified. Colonic diverticulosis noted without evidence of diverticulitis. No definite bowel wall thickening identified. The appendix is normal. Vascular/Lymphatic: Aortic atherosclerosis. No enlarged abdominal or pelvic lymph nodes. Reproductive: Prostate is unremarkable. Other: No ascites, pneumoperitoneum or abscess. Small bilateral inguinal hernias containing fat are present. Musculoskeletal: No acute or suspicious bony abnormality.  IMPRESSION: 1. Distended loops of small bowel without discrete transition point. Suspect small bowel obstruction-possibly low-grade. No ascites or pneumoperitoneum. 2.  Aortic Atherosclerosis (ICD10-I70.0). Electronically Signed   By: JMargarette CanadaM.D.   On: 02/19/2018 16:28    Review of Systems  Gastrointestinal: Positive for abdominal pain, nausea and vomiting.  All  other systems reviewed and are negative.   Blood pressure (!) 141/81, pulse 74, temperature 98.2 F (36.8 C), temperature source Oral, resp. rate 20, height _0  (1.778 m), weight 108.9 kg (240 lb), SpO2 97 %. Physical Exam  Vitals reviewed. Constitutional: He is oriented to person, place, and time. He appears well-developed and well-nourished.  HENT:  Head: Normocephalic and atraumatic.  Eyes: No scleral icterus.  Cardiovascular: Regular rhythm, normal heart sounds and intact distal pulses. Tachycardia present.  Respiratory: Effort normal and breath sounds normal. He has no wheezes.  GI: Bowel sounds are normal. There is tenderness in the epigastric area. No hernia.  Neurological: He is alert and oriented to person, place, and time.  Skin: Skin is warm and dry.  Psychiatric: He has a normal mood and affect. His behavior is normal.     Assessment/Plan SBO likely adhesive  No indication for surgery at this time. Possibility related to prior surgery Npo, iv fluids, ng tube, sbo protocol Hopefully will resolve conservatively  Rolm Bookbinder, MD 02/19/2018, 5:05 PM

## 2018-02-20 ENCOUNTER — Inpatient Hospital Stay (HOSPITAL_COMMUNITY): Payer: No Typology Code available for payment source

## 2018-02-20 LAB — CBC
HCT: 43.8 % (ref 39.0–52.0)
Hemoglobin: 14.4 g/dL (ref 13.0–17.0)
MCH: 31.2 pg (ref 26.0–34.0)
MCHC: 32.9 g/dL (ref 30.0–36.0)
MCV: 94.8 fL (ref 78.0–100.0)
Platelets: 181 10*3/uL (ref 150–400)
RBC: 4.62 MIL/uL (ref 4.22–5.81)
RDW: 12.3 % (ref 11.5–15.5)
WBC: 7.1 10*3/uL (ref 4.0–10.5)

## 2018-02-20 LAB — BASIC METABOLIC PANEL
Anion gap: 8 (ref 5–15)
BUN: 15 mg/dL (ref 6–20)
CO2: 30 mmol/L (ref 22–32)
Calcium: 8.5 mg/dL — ABNORMAL LOW (ref 8.9–10.3)
Chloride: 107 mmol/L (ref 98–111)
Creatinine, Ser: 1.17 mg/dL (ref 0.61–1.24)
GFR calc Af Amer: >60 mL/min (ref >60)
GFR calc non Af Amer: >60 mL/min (ref >60)
Glucose, Bld: 96 mg/dL (ref 70–99)
Potassium: 3.7 mmol/L (ref 3.5–5.1)
Sodium: 145 mmol/L (ref 135–145)

## 2018-02-20 NOTE — Discharge Summary (Signed)
[facility] Discharge Summary   Patient ID: Jermaine Walls. MRN: [id number] DOB/AGE: [birth year] 52 y.o.  Admit date: 02/19/2018 Discharge date: 02/20/2018  Admitting Diagnosis: SBO  Discharge Diagnosis Patient Active Problem List   Diagnosis Date Noted  . SBO (small bowel obstruction) ([place]) 02/19/2018  . Psoriasis 09/30/2011  . HYPERTENSION 01/22/2007    Consultants None Imaging: Ct Abdomen Pelvis W Contrast  Result Date: 02/19/2018 CLINICAL DATA:  52 year old male with acute abdominal and pelvic pain today. EXAM: CT ABDOMEN AND PELVIS WITH CONTRAST TECHNIQUE: Multidetector CT imaging of the abdomen and pelvis was performed using the standard protocol following bolus administration of intravenous contrast. CONTRAST:  155mL OMNIPAQUE IOHEXOL 300 MG/ML  SOLN COMPARISON:  10/24/2015 and prior exams FINDINGS: Lower chest: No acute abnormality. Hepatobiliary: The liver and gallbladder are unremarkable except for a small LEFT hepatic cyst. No biliary dilatation. Pancreas: Unremarkable Spleen: Unremarkable Adrenals/Urinary Tract: The kidneys, adrenal glands and bladder are unremarkable. Stomach/Bowel: There are dilated loops of fluid-filled small bowel within the LEFT abdomen and mildly distended small bowel loops containing stool-like material within the LOWER and RIGHT abdomen. No discrete transition point is identified. Colonic diverticulosis noted without evidence of diverticulitis. No definite bowel wall thickening identified. The appendix is normal. Vascular/Lymphatic: Aortic atherosclerosis. No enlarged abdominal or pelvic lymph nodes. Reproductive: Prostate is unremarkable. Other: No ascites, pneumoperitoneum or abscess. Small bilateral inguinal hernias containing fat are present. Musculoskeletal: No acute or suspicious bony abnormality. IMPRESSION: 1. Distended loops of small bowel without discrete transition point. Suspect small bowel obstruction-possibly low-grade. No  ascites or pneumoperitoneum. 2.  Aortic Atherosclerosis (ICD10-I70.0). Electronically Signed   By: Margarette Canada M.D.   On: 02/19/2018 16:28   Dg Abd Portable 1v-small Bowel Obstruction Protocol-initial, 8 Hr Delay  Result Date: 02/20/2018 CLINICAL DATA:  Small bowel obstruction. EXAM: PORTABLE ABDOMEN - 1 VIEW COMPARISON:  Radiograph of February 19, 2018. FINDINGS: The bowel gas pattern is normal. No radio-opaque calculi or other significant radiographic abnormality are seen. Distal tip of nasogastric tube is seen in the stomach. Residual contrast is seen throughout the colon. IMPRESSION: No evidence of bowel obstruction or ileus. Electronically Signed   By: Marijo Conception, M.D.   On: 02/20/2018 07:16   Dg Abd Portable 1v-small Bowel Protocol-position Verification  Result Date: 02/19/2018 CLINICAL DATA:  NG tube placement. EXAM: PORTABLE ABDOMEN - 1 VIEW COMPARISON:  CT abdomen pelvis from same day. FINDINGS: Enteric tube in place with proximal side port at the gastroesophageal junction. Mildly dilated loops of small bowel again noted. IMPRESSION: 1. Enteric tube in place with proximal side port at the gastroesophageal junction. Recommend advancement 4-5 cm. 2. Unchanged small bowel obstruction. Electronically Signed   By: Titus Dubin M.D.   On: 02/19/2018 19:42    Procedures None  Hospital Course:  Jermaine Walls. is a 52yo male with prior history of ventral hernia repair with mesh 2017 who presented to [facility] 7/1 with acute onset abdominal pain, n/v.  Workup included CT scan which showed SBO likely secondary to adhesions.  Patient was admitted, NG tube placed, and small bowel protocol initiated. Delayed film showed resolution of SBO and contrast in colon. NG tube was removed and diet advanced as tolerated. On 7/2 the patient was voiding well, tolerating diet, having bowel function, ambulating well, pain well controlled, vital signs stable and felt stable for discharge home.  Patient will follow up  as below and knows to call with questions or concerns.  Allergies as of 02/20/2018      Reactions   Doxycycline Hyclate Itching, Rash      Medication List    You have not been prescribed any medications.      Follow-up [facility], [place]. Call.   Specialty:  General Surgery Why:  call as needed, you do not have to schedule an appointment Contact information: [address] [place] [phone]          Signed: Wellington Hampshire, [facility] 02/20/2018, 2:17 PM Pager: [phone] Consults: [phone] Mon 7:00 am -11:30 AM Tues-Fri 7:00 am-4:30 pm Sat-Sun 7:00 am-11:30 am

## 2018-02-20 NOTE — Plan of Care (Signed)
  Problem: Education: Goal: Knowledge of General Education information will improve Outcome: Progressing Note:  POC and orders reviewed with pt.   

## 2018-02-20 NOTE — Progress Notes (Signed)
Pt for discharge going home, alert and oriented, discontinued peripheral IV line, given all his personal belongings, given health teachings, no prescription, no appointment, he had a letter for work, assisted by the Nurse tech via wheelchair, no complain of pain at this time.

## 2018-02-20 NOTE — Discharge Instructions (Signed)
Small Bowel Obstruction °A small bowel obstruction means that something is blocking the small bowel. The small bowel is also called the small intestine. It is the long tube that connects the stomach to the colon. An obstruction will stop food and fluids from passing through the small bowel. Treatment depends on what is causing the problem and how bad the problem is. °Follow these instructions at home: °· Get a lot of rest. °· Follow your diet as told by your doctor. You may need to: °? Only drink clear liquids until you start to get better. °? Avoid solid foods as told by your doctor. °· Take over-the-counter and prescription medicines only as told by your doctor. °· Keep all follow-up visits as told by your doctor. This is important. °Contact a doctor if: °· You have a fever. °· You have chills. °Get help right away if: °· You have pain or cramps that get worse. °· You throw up (vomit) blood. °· You have a feeling of being sick to your stomach (nausea) that does not go away. °· You cannot stop throwing up. °· You cannot drink fluids. °· You feel confused. °· You feel dry or thirsty (dehydrated). °· Your belly gets more bloated. °· You feel weak or you pass out (faint). °This information is not intended to replace advice given to you by your health care provider. Make sure you discuss any questions you have with your health care provider. °Document Released: 09/15/2004 Document Revised: 04/04/2016 Document Reviewed: 10/02/2014 °Elsevier Interactive Patient Education © 2018 [employer] ° °

## 2018-02-20 NOTE — Progress Notes (Addendum)
[facility] Progress Note     Subjective: CC- SBO Patient states that he feels better today. Continues to have some abdominal pain but it is improved from yesterday. Denies n/v. Passing flatus.  Xray today shows contrast in colon and no evidence of SBO or ileus.  Objective: Vital signs in last 24 hours: Temp:  [97.5 F (36.4 C)-99 F (37.2 C)] 97.7 F (36.5 C) (07/02 0436) Pulse Rate:  [60-98] 60 (07/02 0436) Resp:  [16-28] 18 (07/02 0436) BP: (126-161)/(81-110) 126/89 (07/02 0436) SpO2:  [95 %-100 %] 99 % (07/02 0436) Weight:  [240 lb (108.9 kg)] 240 lb (108.9 kg) (07/01 1254)    Intake/Output from previous day: 07/01 0701 - 07/02 0700 In: 3040 [I.V.:1100; NG/GT:940; IV Piggyback:1000] Out: 2 [Urine:2] Intake/Output this shift: No intake/output data recorded.  PE: Gen:  Alert, NAD, pleasant HEENT: EOM's intact, pupils equal and round Card:  RRR, no M/G/R heard Pulm:  CTAB, no W/R/R, effort normal Abd: Soft, NT/ND, +BS, no HSM, no hernia Ext:  No erythema, edema, or tenderness BUE/BLE  Psych: A&Ox3  Skin: no rashes noted, warm and dry  Lab Results:  Recent Labs    02/19/18 1314 02/20/18 0558  WBC 9.1 7.1  HGB 15.8 14.4  HCT 45.9 43.8  PLT 205 181   BMET Recent Labs    02/19/18 1354 02/20/18 0558  NA 143 145  K 4.2 3.7  CL 104 107  CO2 27 30  GLUCOSE 123* 96  BUN 12 15  CREATININE 1.10 1.17  CALCIUM 9.2 8.5*   PT/INR No results for input(s): LABPROT, INR in the last 72 hours. CMP     Component Value Date/Time   NA 145 02/20/2018 0558   K 3.7 02/20/2018 0558   CL 107 02/20/2018 0558   CO2 30 02/20/2018 0558   GLUCOSE 96 02/20/2018 0558   BUN 15 02/20/2018 0558   CREATININE 1.17 02/20/2018 0558   CALCIUM 8.5 (L) 02/20/2018 0558   PROT 6.8 02/19/2018 1354   ALBUMIN 4.2 02/19/2018 1354   AST 22 02/19/2018 1354   ALT 24 02/19/2018 1354   ALKPHOS 44 02/19/2018 1354   BILITOT 0.8 02/19/2018 1354   GFRNONAA >60 02/20/2018 0558    GFRAA >60 02/20/2018 0558   Lipase     Component Value Date/Time   LIPASE 25 02/19/2018 1354       Studies/Results: Ct Abdomen Pelvis W Contrast  Result Date: 02/19/2018 CLINICAL DATA:  52 year old male with acute abdominal and pelvic pain today. EXAM: CT ABDOMEN AND PELVIS WITH CONTRAST TECHNIQUE: Multidetector CT imaging of the abdomen and pelvis was performed using the standard protocol following bolus administration of intravenous contrast. CONTRAST:  135mL OMNIPAQUE IOHEXOL 300 MG/ML  SOLN COMPARISON:  10/24/2015 and prior exams FINDINGS: Lower chest: No acute abnormality. Hepatobiliary: The liver and gallbladder are unremarkable except for a small LEFT hepatic cyst. No biliary dilatation. Pancreas: Unremarkable Spleen: Unremarkable Adrenals/Urinary Tract: The kidneys, adrenal glands and bladder are unremarkable. Stomach/Bowel: There are dilated loops of fluid-filled small bowel within the LEFT abdomen and mildly distended small bowel loops containing stool-like material within the LOWER and RIGHT abdomen. No discrete transition point is identified. Colonic diverticulosis noted without evidence of diverticulitis. No definite bowel wall thickening identified. The appendix is normal. Vascular/Lymphatic: Aortic atherosclerosis. No enlarged abdominal or pelvic lymph nodes. Reproductive: Prostate is unremarkable. Other: No ascites, pneumoperitoneum or abscess. Small bilateral inguinal hernias containing fat are present. Musculoskeletal: No acute or suspicious bony abnormality. IMPRESSION: 1. Distended loops  of small bowel without discrete transition point. Suspect small bowel obstruction-possibly low-grade. No ascites or pneumoperitoneum. 2.  Aortic Atherosclerosis (ICD10-I70.0). Electronically Signed   By: Margarette Canada M.D.   On: 02/19/2018 16:28   Dg Abd Portable 1v-small Bowel Obstruction Protocol-initial, 8 Hr Delay  Result Date: 02/20/2018 CLINICAL DATA:  Small bowel obstruction. EXAM:  PORTABLE ABDOMEN - 1 VIEW COMPARISON:  Radiograph of February 19, 2018. FINDINGS: The bowel gas pattern is normal. No radio-opaque calculi or other significant radiographic abnormality are seen. Distal tip of nasogastric tube is seen in the stomach. Residual contrast is seen throughout the colon. IMPRESSION: No evidence of bowel obstruction or ileus. Electronically Signed   By: Marijo Conception, M.D.   On: 02/20/2018 07:16   Dg Abd Portable 1v-small Bowel Protocol-position Verification  Result Date: 02/19/2018 CLINICAL DATA:  NG tube placement. EXAM: PORTABLE ABDOMEN - 1 VIEW COMPARISON:  CT abdomen pelvis from same day. FINDINGS: Enteric tube in place with proximal side port at the gastroesophageal junction. Mildly dilated loops of small bowel again noted. IMPRESSION: 1. Enteric tube in place with proximal side port at the gastroesophageal junction. Recommend advancement 4-5 cm. 2. Unchanged small bowel obstruction. Electronically Signed   By: Titus Dubin M.D.   On: 02/19/2018 19:42    Anti-infectives: Anti-infectives (From admission, onward)   None       Assessment/Plan SBO - prior h/o lap umbilical hernia repair - CT 7/1 showed distended loops of small bowel without discrete transition point. Suspect small bowel obstruction-possibly low-grade  ID - none FEN - IVF, CLD VTE - SCDs, lovenox Foley - none  Plan - Xray shows contrast in colon and patient is passing flatus. D/c NG tube and advance to clear liquids. If tolerating clears ok to advance to fulls later today. Continue ambulating.   LOS: 1 day    Wellington Hampshire , [facility] 02/20/2018, 8:33 AM Pager: [phone] Consults: [phone] Mon 7:00 am -11:30 AM Tues-Fri 7:00 am-4:30 pm Sat-Sun 7:00 am-11:30 am

## 2018-02-21 ENCOUNTER — Other Ambulatory Visit: Payer: Self-pay | Admitting: *Deleted

## 2018-02-21 NOTE — Patient Outreach (Addendum)
[facility]) Care Management  02/21/2018  Jermaine Walls. 07/10/1966 [id number]   Subjective: Telephone call to patient's home  / mobile number, no answer, left HIPAA compliant voicemail message, and requested call back.    Objective: Per KPN (Knowledge Performance Now, point of care tool) and chart review, patient hospitalized 02/19/18 -02/20/18 for small bowel obstruction secondary to adhesions.   Patient also has a history of hypertension and psoriasis.    Assessment: Received [facility] Plan transition of care referral on 02/20/18.   Transition of care follow up pending patient contact.     Plan: RNCM will send unsuccessful outreach  letter, [facility] pamphlet, will ask covering RNCM to call patient for 2nd telephone outreach attempt within 4 business days, transition of care follow up, and proceed with case closure, within 10 business days if no return call.       Stephine Langbehn H. Annia Friendly, BSN, [place] [facility] Telephonic CM Phone: [phone] Fax: [phone]

## 2018-02-23 ENCOUNTER — Other Ambulatory Visit: Payer: Self-pay | Admitting: *Deleted

## 2018-02-23 ENCOUNTER — Encounter: Payer: Self-pay | Admitting: *Deleted

## 2018-02-23 NOTE — Patient Outreach (Addendum)
[facility]) Care Management  02/23/2018  [name] 07-Aug-1966 [id number]   Spoke with patient via mobile number and completed transtion of care assessment. See transition of care template for details. Jermaine Walls was admitted to [facility] on 02/19/18 for small bowel obstruction and and discharged home on 7/2. HE did not require the services of home health or DME. He did not require surgery.  Patient denies pain, fever, bowel or bladder issues and states he is tolerating food and fluid without difficulty.  No care management needs identified so will close case to [facility] care management services and mail successful outreach letter to patient.  Jermaine Ellison RN,CCM,CDE [place] [facility] Coordinator Office Phone [phone] Office Fax [phone]

## 2018-10-26 ENCOUNTER — Observation Stay (HOSPITAL_COMMUNITY): Payer: No Typology Code available for payment source

## 2018-10-26 ENCOUNTER — Emergency Department (HOSPITAL_COMMUNITY): Payer: No Typology Code available for payment source

## 2018-10-26 ENCOUNTER — Observation Stay (HOSPITAL_COMMUNITY)
Admission: EM | Admit: 2018-10-26 | Discharge: 2018-10-26 | Disposition: A | Discharge status: Home / Self Care | Payer: No Typology Code available for payment source | Attending: General Surgery | Admitting: General Surgery

## 2018-10-26 ENCOUNTER — Encounter (HOSPITAL_COMMUNITY): Payer: Self-pay

## 2018-10-26 ENCOUNTER — Other Ambulatory Visit: Payer: Self-pay

## 2018-10-26 DIAGNOSIS — Z79899 Other long term (current) drug therapy: Secondary | ICD-10-CM | POA: Diagnosis not present

## 2018-10-26 DIAGNOSIS — K56600 Partial intestinal obstruction, unspecified as to cause: Principal | ICD-10-CM | POA: Insufficient documentation

## 2018-10-26 DIAGNOSIS — Z8249 Family history of ischemic heart disease and other diseases of the circulatory system: Secondary | ICD-10-CM | POA: Insufficient documentation

## 2018-10-26 DIAGNOSIS — L409 Psoriasis, unspecified: Secondary | ICD-10-CM | POA: Insufficient documentation

## 2018-10-26 DIAGNOSIS — K56609 Unspecified intestinal obstruction, unspecified as to partial versus complete obstruction: Secondary | ICD-10-CM | POA: Diagnosis present

## 2018-10-26 DIAGNOSIS — Z888 Allergy status to other drugs, medicaments and biological substances status: Secondary | ICD-10-CM | POA: Insufficient documentation

## 2018-10-26 DIAGNOSIS — Z0189 Encounter for other specified special examinations: Secondary | ICD-10-CM | POA: Diagnosis present

## 2018-10-26 DIAGNOSIS — Z791 Long term (current) use of non-steroidal anti-inflammatories (NSAID): Secondary | ICD-10-CM | POA: Diagnosis not present

## 2018-10-26 DIAGNOSIS — I10 Essential (primary) hypertension: Secondary | ICD-10-CM | POA: Insufficient documentation

## 2018-10-26 LAB — CBC
HCT: 47.3 % (ref 39.0–52.0)
Hemoglobin: 16.1 g/dL (ref 13.0–17.0)
MCH: 30.5 pg (ref 26.0–34.0)
MCHC: 34 g/dL (ref 30.0–36.0)
MCV: 89.6 fL (ref 80.0–100.0)
Platelets: 211 10*3/uL (ref 150–400)
RBC: 5.28 MIL/uL (ref 4.22–5.81)
RDW: 12.2 % (ref 11.5–15.5)
WBC: 9.8 10*3/uL (ref 4.0–10.5)
nRBC: 0 % (ref 0.0–0.2)

## 2018-10-26 LAB — COMPREHENSIVE METABOLIC PANEL
ALT: 29 U/L (ref 0–44)
AST: 25 U/L (ref 15–41)
Albumin: 4.5 g/dL (ref 3.5–5.0)
Alkaline Phosphatase: 51 U/L (ref 38–126)
Anion gap: 11 (ref 5–15)
BUN: 21 mg/dL — ABNORMAL HIGH (ref 6–20)
CO2: 24 mmol/L (ref 22–32)
Calcium: 9.7 mg/dL (ref 8.9–10.3)
Chloride: 103 mmol/L (ref 98–111)
Creatinine, Ser: 1.1 mg/dL (ref 0.61–1.24)
GFR calc Af Amer: >60 mL/min (ref >60)
GFR calc non Af Amer: >60 mL/min (ref >60)
Glucose, Bld: 135 mg/dL — ABNORMAL HIGH (ref 70–99)
Potassium: 4.1 mmol/L (ref 3.5–5.1)
Sodium: 138 mmol/L (ref 135–145)
Total Bilirubin: 1 mg/dL (ref 0.3–1.2)
Total Protein: 7.2 g/dL (ref 6.5–8.1)

## 2018-10-26 LAB — LIPASE, BLOOD: Lipase: 29 U/L (ref 11–51)

## 2018-10-26 MED ORDER — TRIAMTERENE-HCTZ 37.5-25 MG PO TABS
1.0000 | ORAL_TABLET | Freq: Every day | ORAL | Status: DC
  Filled 2018-10-26: qty 1

## 2018-10-26 MED ORDER — SODIUM CHLORIDE 0.9% FLUSH
3.0000 mL | Freq: Once | INTRAVENOUS | Status: AC
  Administered 2018-10-26: 3 mL via INTRAVENOUS

## 2018-10-26 MED ORDER — PROMETHAZINE HCL 25 MG/ML IJ SOLN: 12.5000 mg | Freq: Four times a day (QID) | INTRAMUSCULAR | Status: DC | PRN

## 2018-10-26 MED ORDER — AMLODIPINE BESYLATE 5 MG PO TABS: 5.0000 mg | ORAL_TABLET | Freq: Every day | ORAL | Status: DC

## 2018-10-26 MED ORDER — MORPHINE SULFATE (PF) 4 MG/ML IV SOLN
4.0000 mg | Freq: Once | INTRAVENOUS | Status: AC
  Administered 2018-10-26: 4 mg via INTRAVENOUS
  Filled 2018-10-26: qty 1

## 2018-10-26 MED ORDER — DIATRIZOATE MEGLUMINE & SODIUM 66-10 % PO SOLN
90.0000 mL | Freq: Once | ORAL | Status: AC
  Administered 2018-10-26: 90 mL via NASOGASTRIC
  Filled 2018-10-26: qty 90

## 2018-10-26 MED ORDER — ONDANSETRON 4 MG PO TBDP: 4.0000 mg | ORAL_TABLET | Freq: Four times a day (QID) | ORAL | Status: DC | PRN

## 2018-10-26 MED ORDER — DIPHENHYDRAMINE HCL 12.5 MG/5ML PO ELIX: 12.5000 mg | ORAL_SOLUTION | Freq: Four times a day (QID) | ORAL | Status: DC | PRN

## 2018-10-26 MED ORDER — DIPHENHYDRAMINE HCL 50 MG/ML IJ SOLN: 12.5000 mg | Freq: Three times a day (TID) | INTRAMUSCULAR | Status: DC | PRN

## 2018-10-26 MED ORDER — ACETAMINOPHEN 650 MG RE SUPP: 650.0000 mg | Freq: Four times a day (QID) | RECTAL | Status: DC | PRN

## 2018-10-26 MED ORDER — ONDANSETRON HCL 4 MG/2ML IJ SOLN: 4.0000 mg | Freq: Four times a day (QID) | INTRAMUSCULAR | Status: DC | PRN

## 2018-10-26 MED ORDER — IOHEXOL 300 MG/ML  SOLN
100.0000 mL | Freq: Once | INTRAMUSCULAR | Status: AC | PRN
  Administered 2018-10-26: 100 mL via INTRAVENOUS

## 2018-10-26 MED ORDER — SODIUM CHLORIDE 0.9 % IV BOLUS
1000.0000 mL | Freq: Once | INTRAVENOUS | Status: AC
  Administered 2018-10-26: 1000 mL via INTRAVENOUS

## 2018-10-26 MED ORDER — DIPHENHYDRAMINE HCL 50 MG/ML IJ SOLN: 12.5000 mg | Freq: Four times a day (QID) | INTRAMUSCULAR | Status: DC | PRN

## 2018-10-26 MED ORDER — ENOXAPARIN SODIUM 40 MG/0.4ML ~~LOC~~ SOLN
40.0000 mg | SUBCUTANEOUS | Status: DC
  Filled 2018-10-26: qty 0.4

## 2018-10-26 MED ORDER — ONDANSETRON HCL 4 MG/2ML IJ SOLN
4.0000 mg | Freq: Once | INTRAMUSCULAR | Status: AC
  Administered 2018-10-26: 4 mg via INTRAVENOUS
  Filled 2018-10-26: qty 2

## 2018-10-26 MED ORDER — PANTOPRAZOLE SODIUM 40 MG IV SOLR: 40.0000 mg | Freq: Every day | INTRAVENOUS | Status: DC

## 2018-10-26 MED ORDER — ACETAMINOPHEN 325 MG PO TABS: 650.0000 mg | ORAL_TABLET | Freq: Four times a day (QID) | ORAL | Status: DC | PRN

## 2018-10-26 MED ORDER — MORPHINE SULFATE (PF) 2 MG/ML IV SOLN
1.0000 mg | INTRAVENOUS | Status: DC | PRN
  Administered 2018-10-26: 1 mg via INTRAVENOUS
  Filled 2018-10-26: qty 1

## 2018-10-26 MED ORDER — KCL IN DEXTROSE-NACL 20-5-0.45 MEQ/L-%-% IV SOLN
INTRAVENOUS | Status: DC
  Administered 2018-10-26: 07:00:00 via INTRAVENOUS
  Filled 2018-10-26: qty 1000

## 2018-10-26 NOTE — ED Triage Notes (Signed)
Pt states that he has been having generalized abd pain that started this evening with two episodes of vomiting while at work. Hx of bowel obstruction

## 2018-10-26 NOTE — Progress Notes (Signed)
Pt stable for discharge. Pt denies N/V after removal of NG tube and drinking clear liquids X 1 hour. PIV removed without complication. AVS reviewed with pt. Pt confirmed understanding with teachback method. PT escorted off of unit via wheelchair to main lobby.

## 2018-10-26 NOTE — Discharge Instructions (Signed)
Low-Fiber Eating Plan  Fiber is found in fruits, vegetables, whole grains, and beans. Eating a diet low in fiber helps to reduce how often you have bowel movements and how much you produce during a bowel movement. A low-fiber eating plan may help your digestive system heal if:  · You have certain conditions, such as Crohn's disease or diverticulitis.  · You recently had radiation therapy on your pelvis or bowel.  · You recently had intestinal surgery.  · You have a new surgical opening in your abdomen (colostomy or ileostomy).  · Your intestine is narrowed (stricture).  Your health care provider will determine how long you need to stay on this diet. Your health care provider may recommend that you work with a diet and nutrition specialist (dietitian).  What are tips for following this plan?  General guidelines  · Follow recommendations from your dietitian about how much fiber you should have each day.  · Most people on this eating plan should try to eat less than 10 grams (g) of fiber each day. Your daily fiber goal is _________________ g.  · Take vitamin and mineral supplements as told by your health care provider or dietitian. Chewable or liquid forms are best when on this eating plan.  Reading food labels  · Check food labels for the amount of dietary fiber.  · Choose foods that have less than 2 grams of fiber in one serving.  Cooking  · Use white flour and other allowed grains for baking and cooking.  · Cook meat using methods that keep it tender, such as braising or poaching.  · Cook eggs until the yolk is completely solid.  · Cook with healthy oils, such as olive oil or canola oil.  Meal planning    · Eat 5-6 small meals throughout the day instead of 3 large meals.  · If you are lactose intolerant:  ? Choose low-lactose dairy foods.  ? Do not eat dairy foods, if told by your dietitian.  · Limit fat and oils to less than 8 teaspoons a day.  · Eat small portions of desserts.  What foods are allowed?  The items  listed below may not be a complete list. Talk with your dietitian about what dietary choices are best for you.  Grains  All bread and crackers made with white flour. Waffles, pancakes, and French toast. Bagels. Pretzels. Melba toast, zwieback, and matzoh. Cooked and dried cereals that do not contain whole grains, added fiber, seeds, or dried fruit. Cornmeal. Farina. Hot and cold cereals made with refined corn, wheat, rice, or oats. Plain pasta and noodles. White rice.  Vegetables  Well-cooked or canned vegetables without skin, seeds, or stems. Cooked potatoes without skins. Vegetable juice.  Fruits  Soft-cooked or canned fruits without skin and seeds. Peeled ripe banana. Applesauce. Fruit juice without pulp.  Meats and other protein foods  Ground meat. Tender cuts of meat or poultry. Eggs. Fish, seafood, and shellfish. Smooth nut butters. Tofu.  Dairy  All milk products and drinks. Lactose-free milks, including rice, soy, and almond milks. Yogurt without fruit, nuts, chocolate, or granola mix-ins. Sour cream. Cottage cheese. Cheese.  Beverages  Decaf coffee. Fruit and vegetable juices or smoothies (in small amounts, with no pulp or skins, and with fruits from allowed list). Sports drinks. Herbal tea.  Fats and oils  Olive oil, canola oil, sunflower oil, flaxseed oil, and grapeseed oil. Mayonnaise. Cream cheese. Margarine. Butter.  Sweets and desserts  Plain cakes and cookies. Cream   pies and pies made with allowed fruits. Pudding. Custard. Fruit gelatin. Sherbet. Popsicles. Ice cream without nuts. Plain hard candy. Honey. Jelly. Molasses. Syrups, including chocolate syrup. Chocolate. Marshmallows. Gumdrops.  Seasoning and other foods  Bouillon. Broth. Cream soups made from allowed foods. Strained soup. Casseroles made with allowed foods. Ketchup. Mild mustard. Mild salad dressings. Plain gravies. Vinegar. Spices in moderation. Salt. Sugar.  What foods are not allowed?  The items listed below may not be a complete  list. Talk with your dietitian about what dietary choices are best for you.  Grains  Whole wheat and whole grain breads and crackers. Multigrain breads and crackers. Rye bread. Whole grain or multigrain cereals. Cereals with nuts, raisins, or coconut. Bran. Coarse wheat cereals. Granola. High-fiber cereals. Cornmeal or corn bread. Whole grain pasta. Wild or brown rice. Quinoa. Popcorn. Buckwheat. Wheat germ.  Vegetables  Potato skins. Raw or undercooked vegetables. All beans and bean sprouts. Cooked greens. Corn. Peas. Cabbage. Beets. Broccoli. Brussels sprouts. Cauliflower. Mushrooms. Onions. Peppers. Parsnips. Okra. Sauerkraut.  Fruit  Raw or dried fruit. Berries. Fruit juice with pulp. Prune juice.  Meats and other protein foods  Tough, fibrous meats with gristle. Fatty meat. Poultry with skin. Fried meat, poultry, or fish. Deli or lunch meats. Sausage, bacon, and hot dogs. Nuts and chunky nut butter. Dried peas, beans, and lentils.  Dairy  Yogurt with fruit, nuts, chocolate, or granola mix-ins.  Beverages  Caffeinated coffee and teas.  Fats and oils  Avocado. Coconut.  Sweets and desserts  Desserts, cookies, or candies that contain nuts or coconut. Dried fruit. Jams and preserves with seeds. Marmalade. Any dessert made with fruits or grains that are not allowed.  Seasoning and other foods  Corn tortilla chips. Soups made with vegetables or grains that are not allowed. Relish. Horseradish. Pickles. Olives.  Summary  · Most people on a low-fiber eating plan should eat less than 10 grams of fiber a day. Follow recommendations from your dietitian about how much fiber you should have each day.  · Always check food labels to see the dietary fiber content of packaged foods. In general, a low-fiber food will have fewer than 2 grams of fiber per serving.  · In general, try to avoid whole grains, raw fruits and vegetables, dried fruit, tough cuts of meat, nuts, and seeds.  · Take a vitamin and mineral supplement as told  by your health care provider or dietitian.  This information is not intended to replace advice given to you by your health care provider. Make sure you discuss any questions you have with your health care provider.  Document Released: 01/28/2002 Document Revised: 10/11/2016 Document Reviewed: 10/11/2016  Elsevier Interactive Patient Education © 2019 Elsevier Inc.

## 2018-10-26 NOTE — ED Provider Notes (Signed)
[facility] EMERGENCY DEPARTMENT Provider Note   CSN: [id number] Arrival date & time: 10/26/18  0041    History   Chief Complaint Chief Complaint  Patient presents with  . Abdominal Pain    HPI Jermaine Walls. is a 53 y.o. male.     Patient is a 53 year old male with history of hypertension, laparoscopic hernia repair, recurrent small bowel obstructions.  He presents today with complaints of generalized abdominal cramping, nausea, and vomiting that started abruptly several hours ago.  Patient reports no bowel movement or passing of flatus since the pain began.  He denies fevers or chills.  He denies any recent bloody stool or vomit.  The history is provided by the patient.  Abdominal Pain  Pain location:  Generalized Pain quality: cramping   Pain severity:  Severe Onset quality:  Sudden Duration:  3 hours Timing:  Constant Progression:  Worsening Chronicity:  New Relieved by:  Nothing Worsened by:  Nothing Ineffective treatments:  None tried Associated symptoms: no fever and no shortness of breath     Past Medical History:  Diagnosis Date  . Hypertension    treated in past. No longer on medications (02/19/2018)  . Pneumonia 1990s X 1  . Psoriasis    Danella Sensing, MD dermatology  . Small bowel obstruction ([place]) 02/19/2018    Patient Active Problem List   Diagnosis Date Noted  . SBO (small bowel obstruction) ([place]) 02/19/2018  . Psoriasis 09/30/2011  . HYPERTENSION 01/22/2007    Past Surgical History:  Procedure Laterality Date  . ESOPHAGOGASTRODUODENOSCOPY (EGD) WITH ESOPHAGEAL DILATION  1976  . HERNIA REPAIR    . INSERTION OF MESH N/A 10/05/2015   Procedure: INSERTION OF MESH;  Surgeon: Ralene Ok, MD;  Location: [facility];  Service: General;  Laterality: N/A;  . LASIK Bilateral 2003  . UMBILICAL HERNIA REPAIR N/A 10/05/2015   Procedure: LAPAROSCOPIC UMBILICAL HERNIA REPAIR WITH MESH;  Surgeon: Ralene Ok, MD;  Location: [facility];   Service: General;  Laterality: N/A;        Home Medications    Prior to Admission medications   Not on File    Family History Family History  Problem Relation Age of Onset  . Cancer Mother        lung, smoker  . Heart disease Father        52, mid to late 2s first bypass. grandfather died at 26.   . Diabetes Father   . Cancer Sister        lung, smoker  . Colon cancer Neg Hx   . Esophageal cancer Neg Hx   . Liver cancer Neg Hx   . Pancreatic cancer Neg Hx   . Rectal cancer Neg Hx   . Stomach cancer Neg Hx     Social History Social History   Tobacco Use  . Smoking status: Never Smoker  . Smokeless tobacco: Former Systems developer    Types: Snuff  Substance Use Topics  . Alcohol use: Yes    Alcohol/week: 8.0 standard drinks    Types: 8 Cans of beer per week  . Drug use: Never     Allergies   Doxycycline hyclate   Review of Systems Review of Systems  Constitutional: Negative for fever.  Respiratory: Negative for shortness of breath.   Gastrointestinal: Positive for abdominal pain.  All other systems reviewed and are negative.    Physical Exam Updated Vital Signs BP (!) 144/99   Pulse 97   Temp (!)  97.5 F (36.4 C) (Oral)   Resp 20   SpO2 99%   Physical Exam Vitals signs and nursing note reviewed.  Constitutional:      General: He is not in acute distress.    Appearance: He is well-developed. He is not diaphoretic.  HENT:     Head: Normocephalic and atraumatic.  Neck:     Musculoskeletal: Normal range of motion and neck supple.  Cardiovascular:     Rate and Rhythm: Normal rate and regular rhythm.     Heart sounds: No murmur. No friction rub.  Pulmonary:     Effort: Pulmonary effort is normal. No respiratory distress.     Breath sounds: Normal breath sounds. No wheezing or rales.  Abdominal:     General: Bowel sounds are normal. There is no distension.     Palpations: Abdomen is soft.     Tenderness: There is generalized abdominal tenderness. There  is no guarding or rebound.  Musculoskeletal: Normal range of motion.  Skin:    General: Skin is warm and dry.  Neurological:     Mental Status: He is alert and oriented to person, place, and time.     Coordination: Coordination normal.      ED Treatments / Results  Labs (all labs ordered are listed, but only abnormal results are displayed) Labs Reviewed  LIPASE, BLOOD  COMPREHENSIVE METABOLIC PANEL  CBC  URINALYSIS, ROUTINE W REFLEX MICROSCOPIC    EKG None  Radiology No results found.  Procedures Procedures (including critical care time)  Medications Ordered in ED Medications  ondansetron (ZOFRAN) injection 4 mg (has no administration in time range)  morphine 4 MG/ML injection 4 mg (has no administration in time range)  sodium chloride flush (NS) 0.9 % injection 3 mL (3 mLs Intravenous Given 10/26/18 0057)     Initial Impression / Assessment and Plan / ED Course  I have reviewed the triage vital signs and the nursing notes.  Pertinent labs & imaging results that were available during my care of the patient were reviewed by me and considered in my medical decision making (see chart for details).  Patient presenting with complaints of abdominal pain and vomiting.  He has a history of recurrent small bowel obstructions.  Work-up today reveals an early small bowel obstruction on his CT scan.  Laboratory studies are otherwise unremarkable.  Patient given IV fluids and medications and is now feeling better.  Care was discussed with Dr. Redmond Pulling from general surgery who will admit.  NG tube will be placed.  Final Clinical Impressions(s) / ED Diagnoses   Final diagnoses:  None    ED Discharge Orders    None       Veryl Speak, MD 10/26/18 [phone]

## 2018-10-26 NOTE — H&P (Signed)
Jermaine Walls. is an 53 y.o. male.   Chief Complaint: Abdominal pain HPI: 53 year old male came to the emergency room because of acute onset of periumbilical pain radiating to his sides followed by nausea vomiting.  He had previously undergone a laparoscopic repair of an incarcerated umbilical hernia with mesh by Dr. Rosendo Gros a few years ago.  He states this is his third trip to the hospital for bowel obstruction.  His last episode was in July 2019.  It resolved quickly.  Since being here he is already had flatus and had several bowel movements.  He denies any fevers or chills.  He was doing well until yesterday evening until after eating a meal.  He denies any melena hematochezia.  Has hypertension and psoriasis.  He is trying to lose weight  Past Medical History:  Diagnosis Date  . Hypertension    treated in past. No longer on medications (02/19/2018)  . Pneumonia 1990s X 1  . Psoriasis    Danella Sensing, MD dermatology  . Small bowel obstruction ([place]) 02/19/2018    Past Surgical History:  Procedure Laterality Date  . ESOPHAGOGASTRODUODENOSCOPY (EGD) WITH ESOPHAGEAL DILATION  1976  . HERNIA REPAIR    . INSERTION OF MESH N/A 10/05/2015   Procedure: INSERTION OF MESH;  Surgeon: Ralene Ok, MD;  Location: [facility];  Service: General;  Laterality: N/A;  . LASIK Bilateral 2003  . UMBILICAL HERNIA REPAIR N/A 10/05/2015   Procedure: LAPAROSCOPIC UMBILICAL HERNIA REPAIR WITH MESH;  Surgeon: Ralene Ok, MD;  Location: [facility];  Service: General;  Laterality: N/A;    Family History  Problem Relation Age of Onset  . Cancer Mother        lung, smoker  . Heart disease Father        23, mid to late 35s first bypass. grandfather died at 67.   . Diabetes Father   . Cancer Sister        lung, smoker  . Colon cancer Neg Hx   . Esophageal cancer Neg Hx   . Liver cancer Neg Hx   . Pancreatic cancer Neg Hx   . Rectal cancer Neg Hx   . Stomach cancer Neg Hx    Social History:  reports that  he has never smoked. He has quit using smokeless tobacco.  His smokeless tobacco use included snuff. He reports current alcohol use of about 8.0 standard drinks of alcohol per week. He reports that he does not use drugs.  Allergies:  Allergies  Allergen Reactions  . Doxycycline Hyclate Itching and Rash    Facility-Administered Medications Prior to Admission  Medication Dose Route Frequency Provider Last Rate Last Dose  . betamethasone acetate-betamethasone sodium phosphate (CELESTONE) injection 3 mg  3 mg Intramuscular Once Daylene Katayama M, DPM      . betamethasone acetate-betamethasone sodium phosphate (CELESTONE) injection 3 mg  3 mg Intramuscular Once Edrick Kins, DPM       Medications Prior to Admission  Medication Sig Dispense Refill  . amLODipine (NORVASC) 5 MG tablet Take 5 mg by mouth at bedtime.     Marland Kitchen ibuprofen (ADVIL,MOTRIN) 200 MG tablet Take 200-400 mg by mouth every 8 (eight) hours as needed (for pain or headaches).     . triamterene-hydrochlorothiazide (DYAZIDE) 37.5-25 MG capsule Take 1 capsule by mouth at bedtime.       Results for orders placed or performed during the hospital encounter of 10/26/18 (from the past 48 hour(s))  Lipase, blood  Status: None   Collection Time: 10/26/18 12:56 AM  Result Value Ref Range   Lipase 29 11 - 51 U/L    Comment: Performed at [facility], [place] [address] [place], [place] [address]  Comprehensive metabolic panel     Status: Abnormal   Collection Time: 10/26/18 12:56 AM  Result Value Ref Range   Sodium 138 135 - 145 mmol/L   Potassium 4.1 3.5 - 5.1 mmol/L   Chloride 103 98 - 111 mmol/L   CO2 24 22 - 32 mmol/L   Glucose, Bld 135 (H) 70 - 99 mg/dL   BUN 21 (H) 6 - 20 mg/dL   Creatinine, Ser 1.10 0.61 - 1.24 mg/dL   Calcium 9.7 8.9 - 10.3 mg/dL   Total Protein 7.2 6.5 - 8.1 g/dL   Albumin 4.5 3.5 - 5.0 g/dL   AST 25 15 - 41 U/L   ALT 29 0 - 44 U/L   Alkaline Phosphatase 51 38 - 126 U/L   Total Bilirubin 1.0 0.3 -  1.2 mg/dL   GFR calc non Af Amer >60 >60 mL/min   GFR calc Af Amer >60 >60 mL/min   Anion gap 11 5 - 15    Comment: Performed at [facility] [address], [place], [place] [address]  CBC     Status: None   Collection Time: 10/26/18 12:56 AM  Result Value Ref Range   WBC 9.8 4.0 - 10.5 K/uL   RBC 5.28 4.22 - 5.81 MIL/uL   Hemoglobin 16.1 13.0 - 17.0 g/dL   HCT 47.3 39.0 - 52.0 %   MCV 89.6 80.0 - 100.0 fL   MCH 30.5 26.0 - 34.0 pg   MCHC 34.0 30.0 - 36.0 g/dL   RDW 12.2 11.5 - 15.5 %   Platelets 211 150 - 400 K/uL   nRBC 0.0 0.0 - 0.2 %    Comment: Performed at [facility], [place] [address], [place], [place] [address]   Ct Abdomen Pelvis W Contrast  Result Date: 10/26/2018 CLINICAL DATA:  Acute abdominal pain. Vomiting, history of bowel obstruction. EXAM: CT ABDOMEN AND PELVIS WITH CONTRAST TECHNIQUE: Multidetector CT imaging of the abdomen and pelvis was performed using the standard protocol following bolus administration of intravenous contrast. CONTRAST:  172mL OMNIPAQUE IOHEXOL 300 MG/ML  SOLN COMPARISON:  CT 02/19/2018. FINDINGS: Lower chest: Lung bases are clear. Hepatobiliary: Prominent size liver spanning 20.9 cm cranial caudal. Scattered small subcentimeter hypodensities are too small to accurately characterize with likely small cysts. Gallbladder physiologically distended, no calcified stone. No biliary dilatation. Pancreas: No ductal dilatation or inflammation. Spleen: Normal in size without focal abnormality. Adrenals/Urinary Tract: No adrenal nodule No hydronephrosis or perinephric edema. Homogeneous renal enhancement with symmetric excretion on delayed phase imaging. Parapelvic cysts in the left kidney urinary bladder is physiologically distended without wall thickening. Stomach/Bowel: Tiny hiatal hernia. Stomach physiologically distended. Proximal small bowel loops are dilated and fluid-filled. Fecalization of small bowel contents (image 56 series 3) with generalized  transition point just distal to the fecalized contents in the mid anterior abdomen. No mesenteric engorgement, pneumatosis, or free fluid. More distal small bowel is decompressed. Normal appendix. Moderate colonic stool burden. Colonic diverticulosis without diverticulitis. Vascular/Lymphatic: Minimal aortic atherosclerosis. No aneurysm. No enlarged lymph nodes in the abdomen or pelvis. Reproductive: Prostate is unremarkable. Other: Laxity of the abdominal wall at the umbilicus containing small bowel, in the region of bowel obstruction. No free air or ascites. Musculoskeletal: There are no  acute or suspicious osseous abnormalities. Small incidental lipoma within the right external oblique abdominal wall musculature. IMPRESSION: 1. Findings consistent with early/partial small bowel obstruction with transition point left mid anterior abdomen, likely due to adhesions. 2. Colonic diverticulosis without diverticulitis. 3.  Aortic Atherosclerosis (ICD10-I70.0). Electronically Signed   By: Keith Rake M.D.   On: 10/26/2018 02:37   Dg Abd Portable 1v-small Bowel Protocol-position Verification  Result Date: 10/26/2018 CLINICAL DATA:  Confirm nasogastric tube placement EXAM: PORTABLE ABDOMEN - 1 VIEW COMPARISON:  Earlier today FINDINGS: Nasogastric tube tip overlaps the stomach. Small bowel obstruction reported on prior CT is occult by radiography. Lung bases are clear. IMPRESSION: Nasogastric tube tip is at the proximal stomach. Electronically Signed   By: Monte Fantasia M.D.   On: 10/26/2018 04:19    Review of Systems  Constitutional: Negative for chills, fever and weight loss.  HENT: Negative for nosebleeds.   Eyes: Negative for blurred vision.  Respiratory: Negative for shortness of breath.   Cardiovascular: Negative for chest pain, palpitations, orthopnea and PND.       Denies DOE  Gastrointestinal: Positive for abdominal pain, nausea and vomiting.  Genitourinary: Negative for dysuria and hematuria.   Musculoskeletal: Negative.   Skin: Negative for itching and rash.  Neurological: Negative for dizziness, focal weakness, seizures, loss of consciousness and headaches.       Denies TIAs, amaurosis fugax  Endo/Heme/Allergies: Does not bruise/bleed easily.  Psychiatric/Behavioral: The patient is not nervous/anxious.     Blood pressure 135/87, pulse 72, temperature 98.4 F (36.9 C), temperature source Oral, resp. rate 18, weight 111.5 kg, SpO2 96 %. Physical Exam  Vitals reviewed. Constitutional: He is oriented to person, place, and time. He appears well-developed and well-nourished. No distress.  HENT:  Head: Normocephalic and atraumatic.  Right Ear: External ear normal.  Left Ear: External ear normal.  Eyes: Conjunctivae are normal. No scleral icterus.  Neck: Normal range of motion. Neck supple. No tracheal deviation present. No thyromegaly present.  Cardiovascular: Normal rate and normal heart sounds.  Respiratory: Effort normal and breath sounds normal. No stridor. No respiratory distress. He has no wheezes.  GI: Soft. There is no rigidity, no rebound and no guarding. No hernia.  Soft, obese, old trocar sites not really tender definitely no guarding, rebound or peritonitis  Musculoskeletal:        General: No tenderness or edema.  Lymphadenopathy:    He has no cervical adenopathy.  Neurological: He is alert and oriented to person, place, and time. He exhibits normal muscle tone.  Skin: Skin is warm and dry. No rash noted. He is not diaphoretic. No erythema. No pallor.  Psychiatric: He has a normal mood and affect. His behavior is normal. Judgment and thought content normal.    About 400 cc of light bile in the NG can  Assessment/Plan Partial SBO Psoriasis Hypertension  He is well-appearing.  There is no concerning features on CT.  White blood cell count normal.  His exam is also reassuring.  He has already started to have some bowel movements.  Continue small bowel  protocol 8-hour film this afternoon  Greer Pickerel, MD 10/26/2018, 7:07 AM

## 2018-10-26 NOTE — ED Notes (Signed)
ED TO INPATIENT HANDOFF REPORT  ED Nurse Name and Phone #: Jess F [phone]  S Name/Age/Gender Rodman Key. 53 y.o. male Room/Bed: 028C/028C  Code Status   Code Status: Prior  Home/SNF/Other Home Patient oriented to: self, place, time and situation Is this baseline? Yes   Triage Complete: Triage complete  Chief Complaint stomach pain  Triage Note Pt states that he has been having generalized abd pain that started this evening with two episodes of vomiting while at work. Hx of bowel obstruction    Allergies Allergies  Allergen Reactions  . Doxycycline Hyclate Itching and Rash    Level of Care/Admitting Diagnosis ED Disposition    ED Disposition Condition [facility]: [place] [100100]  Level of Care: Med-Surg [16]  Diagnosis: Small bowel obstruction [facility]) [[id number]]  Admitting Physician: CCS, [place]  Attending Physician: CCS, MD [[id number]]  PT Class (Do Not Modify): Observation [104]  PT Acc Code (Do Not Modify): Observation [10022]       B Medical/Surgery History Past Medical History:  Diagnosis Date  . Hypertension    treated in past. No longer on medications (02/19/2018)  . Pneumonia 1990s X 1  . Psoriasis    Danella Sensing, MD dermatology  . Small bowel obstruction ([place]) 02/19/2018   Past Surgical History:  Procedure Laterality Date  . ESOPHAGOGASTRODUODENOSCOPY (EGD) WITH ESOPHAGEAL DILATION  1976  . HERNIA REPAIR    . INSERTION OF MESH N/A 10/05/2015   Procedure: INSERTION OF MESH;  Surgeon: Ralene Ok, MD;  Location: [facility];  Service: General;  Laterality: N/A;  . LASIK Bilateral 2003  . UMBILICAL HERNIA REPAIR N/A 10/05/2015   Procedure: LAPAROSCOPIC UMBILICAL HERNIA REPAIR WITH MESH;  Surgeon: Ralene Ok, MD;  Location: [facility];  Service: General;  Laterality: N/A;     A IV Location/Drains/Wounds Patient Lines/Drains/Airways Status   Active Line/Drains/Airways    Name:   Placement date:   Placement  time:   Site:   Days:   Peripheral IV 10/26/18 Left Forearm   10/26/18    0057    Forearm   less than 1   NG/OG Tube Nasogastric 14 Fr. Left nare Xray;Aucultation   10/26/18    0353    Left nare   less than 1   Incision (Closed) 10/05/15 Abdomen Other (Comment)   10/05/15    0837     1117          Intake/Output Last 24 hours  Intake/Output Summary (Last 24 hours) at 10/26/2018 0408 Last data filed at 10/26/2018 0335 Gross per 24 hour  Intake 1000 ml  Output -  Net 1000 ml    Labs/Imaging Results for orders placed or performed during the hospital encounter of 10/26/18 (from the past 48 hour(s))  Lipase, blood     Status: None   Collection Time: 10/26/18 12:56 AM  Result Value Ref Range   Lipase 29 11 - 51 U/L    Comment: Performed at [facility], [address], [place], [place] [address]  Comprehensive metabolic panel     Status: Abnormal   Collection Time: 10/26/18 12:56 AM  Result Value Ref Range   Sodium 138 135 - 145 mmol/L   Potassium 4.1 3.5 - 5.1 mmol/L   Chloride 103 98 - 111 mmol/L   CO2 24 22 - 32 mmol/L   Glucose, Bld 135 (H) 70 - 99 mg/dL   BUN 21 (H) 6 - 20 mg/dL  Creatinine, Ser 1.10 0.61 - 1.24 mg/dL   Calcium 9.7 8.9 - 10.3 mg/dL   Total Protein 7.2 6.5 - 8.1 g/dL   Albumin 4.5 3.5 - 5.0 g/dL   AST 25 15 - 41 U/L   ALT 29 0 - 44 U/L   Alkaline Phosphatase 51 38 - 126 U/L   Total Bilirubin 1.0 0.3 - 1.2 mg/dL   GFR calc non Af Amer >60 >60 mL/min   GFR calc Af Amer >60 >60 mL/min   Anion gap 11 5 - 15    Comment: Performed at [facility], [place] [address], [place], [place] [address]  CBC     Status: None   Collection Time: 10/26/18 12:56 AM  Result Value Ref Range   WBC 9.8 4.0 - 10.5 K/uL   RBC 5.28 4.22 - 5.81 MIL/uL   Hemoglobin 16.1 13.0 - 17.0 g/dL   HCT 47.3 39.0 - 52.0 %   MCV 89.6 80.0 - 100.0 fL   MCH 30.5 26.0 - 34.0 pg   MCHC 34.0 30.0 - 36.0 g/dL   RDW 12.2 11.5 - 15.5 %   Platelets 211 150 - 400 K/uL   nRBC 0.0 0.0 - 0.2  %    Comment: Performed at [facility], [place] [address], [place], [place] [address]   Ct Abdomen Pelvis W Contrast  Result Date: 10/26/2018 CLINICAL DATA:  Acute abdominal pain. Vomiting, history of bowel obstruction. EXAM: CT ABDOMEN AND PELVIS WITH CONTRAST TECHNIQUE: Multidetector CT imaging of the abdomen and pelvis was performed using the standard protocol following bolus administration of intravenous contrast. CONTRAST:  155mL OMNIPAQUE IOHEXOL 300 MG/ML  SOLN COMPARISON:  CT 02/19/2018. FINDINGS: Lower chest: Lung bases are clear. Hepatobiliary: Prominent size liver spanning 20.9 cm cranial caudal. Scattered small subcentimeter hypodensities are too small to accurately characterize with likely small cysts. Gallbladder physiologically distended, no calcified stone. No biliary dilatation. Pancreas: No ductal dilatation or inflammation. Spleen: Normal in size without focal abnormality. Adrenals/Urinary Tract: No adrenal nodule No hydronephrosis or perinephric edema. Homogeneous renal enhancement with symmetric excretion on delayed phase imaging. Parapelvic cysts in the left kidney urinary bladder is physiologically distended without wall thickening. Stomach/Bowel: Tiny hiatal hernia. Stomach physiologically distended. Proximal small bowel loops are dilated and fluid-filled. Fecalization of small bowel contents (image 56 series 3) with generalized transition point just distal to the fecalized contents in the mid anterior abdomen. No mesenteric engorgement, pneumatosis, or free fluid. More distal small bowel is decompressed. Normal appendix. Moderate colonic stool burden. Colonic diverticulosis without diverticulitis. Vascular/Lymphatic: Minimal aortic atherosclerosis. No aneurysm. No enlarged lymph nodes in the abdomen or pelvis. Reproductive: Prostate is unremarkable. Other: Laxity of the abdominal wall at the umbilicus containing small bowel, in the region of bowel obstruction. No free air or ascites.  Musculoskeletal: There are no acute or suspicious osseous abnormalities. Small incidental lipoma within the right external oblique abdominal wall musculature. IMPRESSION: 1. Findings consistent with early/partial small bowel obstruction with transition point left mid anterior abdomen, likely due to adhesions. 2. Colonic diverticulosis without diverticulitis. 3.  Aortic Atherosclerosis (ICD10-I70.0). Electronically Signed   By: Keith Rake M.D.   On: 10/26/2018 02:37    Pending Labs Unresulted Labs (From admission, onward)    Start     Ordered   10/26/18 0056  Urinalysis, Routine w reflex microscopic  ONCE - STAT,   STAT     10/26/18 0055   Signed and Held  Basic metabolic panel  Tomorrow morning,  R     Signed and Held          Vitals/Pain Today's Vitals   10/26/18 0045 10/26/18 0130 10/26/18 0401 10/26/18 0403  BP: (!) 135/98 (!) 132/94 118/88   Pulse: 92   77  Resp:      Temp:      TempSrc:      SpO2: 96%     PainSc: 10-Worst pain ever       Isolation Precautions No active isolations  Medications Medications  diatrizoate meglumine-sodium (GASTROGRAFIN) 66-10 % solution 90 mL (has no administration in time range)  sodium chloride flush (NS) 0.9 % injection 3 mL (3 mLs Intravenous Given 10/26/18 0057)  ondansetron (ZOFRAN) injection 4 mg (4 mg Intravenous Given 10/26/18 0121)  morphine 4 MG/ML injection 4 mg (4 mg Intravenous Given 10/26/18 0122)  sodium chloride 0.9 % bolus 1,000 mL (0 mLs Intravenous Stopped 10/26/18 0335)  iohexol (OMNIPAQUE) 300 MG/ML solution 100 mL (100 mLs Intravenous Contrast Given 10/26/18 0155)  morphine 4 MG/ML injection 4 mg (4 mg Intravenous Given 10/26/18 0340)    Mobility walks Low fall risk   Focused Assessments GI    R Recommendations: See Admitting Provider Note  Report given to:   Additional Notes: n/a

## 2018-10-26 NOTE — Discharge Summary (Signed)
     Patient ID: Jermaine Walls [id number] 05-22-1966 53 y.o.  Admit date: 10/26/2018 Discharge date: 10/26/2018  Admitting Diagnosis: Small bowel obstruction  Discharge Diagnosis Patient Active Problem List   Diagnosis Date Noted  . Small bowel obstruction ([place]) 10/26/2018  . SBO (small bowel obstruction) ([place]) 02/19/2018  . Psoriasis 09/30/2011  . HYPERTENSION 01/22/2007    Consultants none  Reason for Admission: 54 year old male came to the emergency room because of acute onset of periumbilical pain radiating to his sides followed by nausea vomiting.  He had previously undergone a laparoscopic repair of an incarcerated umbilical hernia with mesh by Dr. Rosendo Gros a few years ago.  He states this is his third trip to the hospital for bowel obstruction.  His last episode was in July 2019.  It resolved quickly.  Since being here he is already had flatus and had several bowel movements.  He denies any fevers or chills.  He was doing well until yesterday evening until after eating a meal.  He denies any melena hematochezia.  Has hypertension and psoriasis.  He is trying to lose weight  Procedures none  Hospital Course:  The patient was admitted and had an NGT placed and underwent the SBO protocol.  By the time he was admitted he was already passing flatus and had had 3BMs.  His 8 hr delay films revealed contrast in the colon already.  He had 2 more BMs as well.  His NGT was removed and his diet was advanced to clear liquids.  He will be discharged home if he tolerates his clear liquids.  He can advance his diet to low fiber as he tolerates it at home.    Physical Exam: Heart: regular Lungs: CTAB Abd: soft, NT, obese, ND, +BS  Allergies as of 10/26/2018      Reactions   Doxycycline Hyclate Itching, Rash      Medication List    TAKE these medications   amLODipine 5 MG tablet Commonly known as:  NORVASC Take 5 mg by mouth at bedtime.   ibuprofen 200 MG tablet Commonly  known as:  ADVIL,MOTRIN Take 200-400 mg by mouth every 8 (eight) hours as needed (for pain or headaches).   triamterene-hydrochlorothiazide 37.5-25 MG capsule Commonly known as:  DYAZIDE Take 1 capsule by mouth at bedtime.        Follow-up Information    Donnie Mesa, MD Follow up in 3 week(s).   Specialty:  General Surgery Why:  as needed due to recent small bowel obstruction Contact information: [place] [address] [phone]           Signed: Saverio Danker, [facility] 10/26/2018, 3:27 PM Pager: [phone]

## 2018-10-26 NOTE — Progress Notes (Signed)
Patient admitted for SBO, arrived into the unit at 510am. Pt. Is alert and oriented, skin intact on assessment on sign of distress note.  NGT connected to low intermittent wall suction at 6 am.  IR called to confirm the time of x-ray which will be 1pm, that is 8hrs after the contrast. Will continue to monitor

## 2019-02-17 ENCOUNTER — Encounter (HOSPITAL_COMMUNITY): Payer: Self-pay | Admitting: *Deleted

## 2019-02-17 ENCOUNTER — Other Ambulatory Visit: Payer: Self-pay

## 2019-02-17 ENCOUNTER — Observation Stay (HOSPITAL_COMMUNITY)
Admission: EM | Admit: 2019-02-17 | Discharge: 2019-02-18 | Disposition: A | Discharge status: Home / Self Care | Payer: No Typology Code available for payment source | Attending: Physician Assistant | Admitting: Physician Assistant

## 2019-02-17 ENCOUNTER — Observation Stay (HOSPITAL_COMMUNITY): Payer: No Typology Code available for payment source

## 2019-02-17 ENCOUNTER — Emergency Department (HOSPITAL_COMMUNITY): Payer: No Typology Code available for payment source

## 2019-02-17 DIAGNOSIS — Z8249 Family history of ischemic heart disease and other diseases of the circulatory system: Secondary | ICD-10-CM | POA: Insufficient documentation

## 2019-02-17 DIAGNOSIS — K56609 Unspecified intestinal obstruction, unspecified as to partial versus complete obstruction: Principal | ICD-10-CM | POA: Diagnosis present

## 2019-02-17 DIAGNOSIS — Z79899 Other long term (current) drug therapy: Secondary | ICD-10-CM | POA: Diagnosis not present

## 2019-02-17 DIAGNOSIS — K565 Intestinal adhesions [bands], unspecified as to partial versus complete obstruction: Secondary | ICD-10-CM | POA: Insufficient documentation

## 2019-02-17 DIAGNOSIS — Z1159 Encounter for screening for other viral diseases: Secondary | ICD-10-CM | POA: Diagnosis not present

## 2019-02-17 DIAGNOSIS — L409 Psoriasis, unspecified: Secondary | ICD-10-CM | POA: Diagnosis not present

## 2019-02-17 DIAGNOSIS — I10 Essential (primary) hypertension: Secondary | ICD-10-CM | POA: Diagnosis not present

## 2019-02-17 DIAGNOSIS — Z888 Allergy status to other drugs, medicaments and biological substances status: Secondary | ICD-10-CM | POA: Insufficient documentation

## 2019-02-17 LAB — COMPREHENSIVE METABOLIC PANEL
ALT: 35 U/L (ref 0–44)
AST: 28 U/L (ref 15–41)
Albumin: 4.1 g/dL (ref 3.5–5.0)
Alkaline Phosphatase: 51 U/L (ref 38–126)
Anion gap: 15 (ref 5–15)
BUN: 15 mg/dL (ref 6–20)
CO2: 22 mmol/L (ref 22–32)
Calcium: 9.2 mg/dL (ref 8.9–10.3)
Chloride: 102 mmol/L (ref 98–111)
Creatinine, Ser: 1.1 mg/dL (ref 0.61–1.24)
GFR calc Af Amer: >60 mL/min (ref >60)
GFR calc non Af Amer: >60 mL/min (ref >60)
Glucose, Bld: 140 mg/dL — ABNORMAL HIGH (ref 70–99)
Potassium: 4.4 mmol/L (ref 3.5–5.1)
Sodium: 139 mmol/L (ref 135–145)
Total Bilirubin: 0.6 mg/dL (ref 0.3–1.2)
Total Protein: 6.7 g/dL (ref 6.5–8.1)

## 2019-02-17 LAB — URINALYSIS, ROUTINE W REFLEX MICROSCOPIC
Bilirubin Urine: NEGATIVE
Glucose, UA: NEGATIVE mg/dL
Hgb urine dipstick: NEGATIVE
Ketones, ur: NEGATIVE mg/dL
Leukocytes,Ua: NEGATIVE
Nitrite: NEGATIVE
Protein, ur: NEGATIVE mg/dL
Specific Gravity, Urine: 1.033 — ABNORMAL HIGH (ref 1.005–1.030)
pH: 6 (ref 5.0–8.0)

## 2019-02-17 LAB — CBC
HCT: 46.2 % (ref 39.0–52.0)
Hemoglobin: 16 g/dL (ref 13.0–17.0)
MCH: 31.1 pg (ref 26.0–34.0)
MCHC: 34.6 g/dL (ref 30.0–36.0)
MCV: 89.9 fL (ref 80.0–100.0)
Platelets: 227 10*3/uL (ref 150–400)
RBC: 5.14 MIL/uL (ref 4.22–5.81)
RDW: 12.3 % (ref 11.5–15.5)
WBC: 10 10*3/uL (ref 4.0–10.5)
nRBC: 0 % (ref 0.0–0.2)

## 2019-02-17 LAB — LIPASE, BLOOD: Lipase: 29 U/L (ref 11–51)

## 2019-02-17 MED ORDER — HYDROMORPHONE HCL 1 MG/ML IJ SOLN
0.5000 mg | Freq: Once | INTRAMUSCULAR | Status: AC
  Administered 2019-02-17: 0.5 mg via INTRAVENOUS
  Filled 2019-02-17: qty 1

## 2019-02-17 MED ORDER — KETOROLAC TROMETHAMINE 30 MG/ML IJ SOLN
30.0000 mg | Freq: Four times a day (QID) | INTRAMUSCULAR | Status: DC | PRN
  Administered 2019-02-17: 30 mg via INTRAVENOUS
  Filled 2019-02-17: qty 1

## 2019-02-17 MED ORDER — ONDANSETRON HCL 4 MG/2ML IJ SOLN
4.0000 mg | Freq: Once | INTRAMUSCULAR | Status: AC | PRN
  Administered 2019-02-17: 4 mg via INTRAVENOUS
  Filled 2019-02-17: qty 2

## 2019-02-17 MED ORDER — MORPHINE SULFATE (PF) 2 MG/ML IV SOLN
1.0000 mg | INTRAVENOUS | Status: DC | PRN
  Administered 2019-02-17: 1 mg via INTRAVENOUS
  Filled 2019-02-17: qty 1

## 2019-02-17 MED ORDER — ONDANSETRON HCL 4 MG/2ML IJ SOLN
4.0000 mg | Freq: Four times a day (QID) | INTRAMUSCULAR | Status: DC | PRN
  Administered 2019-02-17 (×2): 4 mg via INTRAVENOUS
  Filled 2019-02-17 (×2): qty 2

## 2019-02-17 MED ORDER — HYDRALAZINE HCL 20 MG/ML IJ SOLN: 10.0000 mg | INTRAMUSCULAR | Status: DC | PRN

## 2019-02-17 MED ORDER — SIMETHICONE 80 MG PO CHEW: 40.0000 mg | CHEWABLE_TABLET | Freq: Four times a day (QID) | ORAL | Status: DC | PRN

## 2019-02-17 MED ORDER — SODIUM CHLORIDE 0.9% FLUSH
3.0000 mL | Freq: Once | INTRAVENOUS | Status: AC
  Administered 2019-02-17: 3 mL via INTRAVENOUS

## 2019-02-17 MED ORDER — ONDANSETRON 4 MG PO TBDP: 4.0000 mg | ORAL_TABLET | Freq: Four times a day (QID) | ORAL | Status: DC | PRN

## 2019-02-17 MED ORDER — MORPHINE SULFATE (PF) 2 MG/ML IV SOLN
2.0000 mg | INTRAVENOUS | Status: DC | PRN
  Administered 2019-02-17 – 2019-02-18 (×4): 4 mg via INTRAVENOUS
  Filled 2019-02-17 (×4): qty 2

## 2019-02-17 MED ORDER — IOHEXOL 300 MG/ML  SOLN
100.0000 mL | Freq: Once | INTRAMUSCULAR | Status: AC | PRN
  Administered 2019-02-17: 100 mL via INTRAVENOUS

## 2019-02-17 MED ORDER — POTASSIUM CHLORIDE IN NACL 20-0.9 MEQ/L-% IV SOLN
INTRAVENOUS | Status: DC
  Administered 2019-02-17 – 2019-02-18 (×3): via INTRAVENOUS
  Filled 2019-02-17: qty 1000
  Filled 2019-02-17: qty 2000
  Filled 2019-02-17 (×2): qty 1000

## 2019-02-17 MED ORDER — ACETAMINOPHEN 650 MG RE SUPP: 650.0000 mg | Freq: Four times a day (QID) | RECTAL | Status: DC | PRN

## 2019-02-17 MED ORDER — PROMETHAZINE HCL 25 MG/ML IJ SOLN
12.5000 mg | Freq: Four times a day (QID) | INTRAMUSCULAR | Status: DC | PRN
  Administered 2019-02-17 – 2019-02-18 (×4): 12.5 mg via INTRAVENOUS
  Filled 2019-02-17 (×5): qty 1

## 2019-02-17 MED ORDER — ACETAMINOPHEN 325 MG PO TABS: 650.0000 mg | ORAL_TABLET | Freq: Four times a day (QID) | ORAL | Status: DC | PRN

## 2019-02-17 MED ORDER — PANTOPRAZOLE SODIUM 40 MG IV SOLR
40.0000 mg | Freq: Every day | INTRAVENOUS | Status: DC
  Administered 2019-02-17: 40 mg via INTRAVENOUS
  Filled 2019-02-17: qty 40

## 2019-02-17 MED ORDER — DIPHENHYDRAMINE HCL 50 MG/ML IJ SOLN: 25.0000 mg | Freq: Four times a day (QID) | INTRAMUSCULAR | Status: DC | PRN

## 2019-02-17 MED ORDER — DIATRIZOATE MEGLUMINE & SODIUM 66-10 % PO SOLN
90.0000 mL | Freq: Once | ORAL | Status: AC
  Administered 2019-02-17: 11:00:00 90 mL via NASOGASTRIC
  Filled 2019-02-17: qty 90

## 2019-02-17 MED ORDER — HYDROMORPHONE HCL 1 MG/ML IJ SOLN
1.0000 mg | Freq: Once | INTRAMUSCULAR | Status: AC
  Administered 2019-02-17: 1 mg via INTRAVENOUS
  Filled 2019-02-17: qty 1

## 2019-02-17 MED ORDER — ENOXAPARIN SODIUM 40 MG/0.4ML ~~LOC~~ SOLN
40.0000 mg | SUBCUTANEOUS | Status: DC
  Filled 2019-02-17: qty 0.4

## 2019-02-17 MED ORDER — SODIUM CHLORIDE 0.9 % IV BOLUS
1000.0000 mL | Freq: Once | INTRAVENOUS | Status: AC
  Administered 2019-02-17: 1000 mL via INTRAVENOUS

## 2019-02-17 MED ORDER — DIPHENHYDRAMINE HCL 25 MG PO CAPS: 25.0000 mg | ORAL_CAPSULE | Freq: Four times a day (QID) | ORAL | Status: DC | PRN

## 2019-02-17 MED ORDER — MORPHINE SULFATE (PF) 2 MG/ML IV SOLN
1.0000 mg | INTRAVENOUS | Status: DC | PRN
  Administered 2019-02-17 (×2): 2 mg via INTRAVENOUS
  Filled 2019-02-17 (×2): qty 1

## 2019-02-17 NOTE — ED Triage Notes (Signed)
Pt c/o LUQ pain; hx of SBO x3 that have required admission. NV, able to pass gas, has not had a BM

## 2019-02-17 NOTE — H&P (Signed)
[facility] Surgery Admission Note  Jermaine Walls. [birth year]  [id number].    Requesting MD: Addison Lank Chief Complaint/Reason for Consult: SBO  HPI:  Jermaine Walls. is a 53yo male PMH HTN, psoriasis, and h/o laparoscopic umbilical hernia repair with mesh 10/05/2015 by Dr. Rosendo Gros, who presented to [facility] earlier this morning with acute onset abdominal pain and bloating last night after dinner. He has had multiple SBO's in the past requiring admission, most recently 10/2018 and 02/2018. States that he ate some almonds yesterday which he believes to be the culprit. Abdominal pain is central, constant and severe, at times radiates into his back. Associated symptoms include multiple episodes of nausea and vomiting. Denies fever, chills, dysuria. Last BM was yesterday morning and he passed minimal flatus last night, none today. ED workup included CT scan which shows mildly dilated and fluid-filled small bowel predominantly within the left upper quadrant with a suspected transition point in the ventral midline abdomen. WBC 10. VSS.  Anticoagulants: none Nonsmoker Employment: PACU nurse  ROS: Review of Systems  Constitutional: Negative.   HENT: Negative.   Eyes: Negative.   Respiratory: Negative.   Cardiovascular: Negative.   Gastrointestinal: Positive for abdominal pain, constipation, nausea and vomiting. Negative for diarrhea.  Genitourinary: Negative.   Musculoskeletal: Negative.   Skin:       Psoriasis bilateral knees  Neurological: Negative.     All systems reviewed and otherwise negative except for as above  Family History  Problem Relation Age of Onset  . Cancer Mother        lung, smoker  . Heart disease Father        48, mid to late 20s first bypass. grandfather died at 12.   . Diabetes Father   . Cancer Sister        lung, smoker  . Colon cancer Neg Hx   . Esophageal cancer Neg Hx   . Liver cancer Neg Hx   . Pancreatic cancer Neg Hx   . Rectal  cancer Neg Hx   . Stomach cancer Neg Hx     Past Medical History:  Diagnosis Date  . Hypertension    treated in past. No longer on medications (02/19/2018)  . Pneumonia 1990s X 1  . Psoriasis    Danella Sensing, MD dermatology  . Small bowel obstruction ([place]) 02/19/2018    Past Surgical History:  Procedure Laterality Date  . ESOPHAGOGASTRODUODENOSCOPY (EGD) WITH ESOPHAGEAL DILATION  1976  . HERNIA REPAIR    . INSERTION OF MESH N/A 10/05/2015   Procedure: INSERTION OF MESH;  Surgeon: Ralene Ok, MD;  Location: [place];  Service: General;  Laterality: N/A;  . LASIK Bilateral 2003  . UMBILICAL HERNIA REPAIR N/A 10/05/2015   Procedure: LAPAROSCOPIC UMBILICAL HERNIA REPAIR WITH MESH;  Surgeon: Ralene Ok, MD;  Location: [place];  Service: General;  Laterality: N/A;    Social History:  reports that he has never smoked. He has quit using smokeless tobacco.  His smokeless tobacco use included snuff. He reports current alcohol use of about 8.0 standard drinks of alcohol per week. He reports that he does not use drugs.  Allergies:  Allergies  Allergen Reactions  . Doxycycline Hyclate Itching and Rash    (Not in a hospital admission)   Prior to Admission medications   Medication Sig Start Date End Date Taking? Authorizing Provider  amLODipine (NORVASC) 5 MG tablet Take 5 mg by mouth at bedtime.  10/11/18  Yes [provider]  triamterene-hydrochlorothiazide (DYAZIDE) 37.5-25 MG capsule Take 1 capsule by mouth at bedtime.  10/12/18  Yes [provider]    Blood pressure (!) 141/93, pulse 68, temperature 97.9 F (36.6 C), temperature source Oral, resp. rate 18, SpO2 98 %. Physical Exam: General: pleasant, WD/WN white male who is laying in bed in NAD HEENT: head is normocephalic, atraumatic.  Sclera are noninjected.  Pupils equal and round.  Ears and nose without any masses or lesions.  Mouth is pink and moist. Dentition fair Heart: regular, rate, and rhythm.  No  obvious murmurs, gallops, or rubs noted.  Palpable pedal pulses bilaterally Lungs: CTAB, no wheezes, rhonchi, or rales noted.  Respiratory effort nonlabored Abd: well healed lap incision, obese, soft, distended, periumbilical TTP without rebound or guarding, hypoactive BS, no masses, hernias, or organomegaly MS: all 4 extremities are symmetrical with no cyanosis, clubbing, or edema. Psoriasis rash noted to bilateral anterior knees Skin: warm and dry Psych: A&Ox3 with an appropriate affect. Neuro: cranial nerves grossly intact, extremity CSM intact bilaterally, normal speech  Results for orders placed or performed during the hospital encounter of 02/17/19 (from the past 48 hour(s))  Lipase, blood     Status: None   Collection Time: 02/17/19  4:25 AM  Result Value Ref Range   Lipase 29 11 - 51 U/L    Comment: Performed at [facility], [place] [address] [place], [place] [address]  Comprehensive metabolic panel     Status: Abnormal   Collection Time: 02/17/19  4:25 AM  Result Value Ref Range   Sodium 139 135 - 145 mmol/L   Potassium 4.4 3.5 - 5.1 mmol/L   Chloride 102 98 - 111 mmol/L   CO2 22 22 - 32 mmol/L   Glucose, Bld 140 (H) 70 - 99 mg/dL   BUN 15 6 - 20 mg/dL   Creatinine, Ser 1.10 0.61 - 1.24 mg/dL   Calcium 9.2 8.9 - 10.3 mg/dL   Total Protein 6.7 6.5 - 8.1 g/dL   Albumin 4.1 3.5 - 5.0 g/dL   AST 28 15 - 41 U/L   ALT 35 0 - 44 U/L   Alkaline Phosphatase 51 38 - 126 U/L   Total Bilirubin 0.6 0.3 - 1.2 mg/dL   GFR calc non Af Amer >60 >60 mL/min   GFR calc Af Amer >60 >60 mL/min   Anion gap 15 5 - 15    Comment: Performed at [facility] [address] [place], [place] [address]  CBC     Status: None   Collection Time: 02/17/19  4:25 AM  Result Value Ref Range   WBC 10.0 4.0 - 10.5 K/uL   RBC 5.14 4.22 - 5.81 MIL/uL   Hemoglobin 16.0 13.0 - 17.0 g/dL   HCT 46.2 39.0 - 52.0 %   MCV 89.9 80.0 - 100.0 fL   MCH 31.1 26.0 - 34.0 pg   MCHC 34.6 30.0 - 36.0 g/dL    RDW 12.3 11.5 - 15.5 %   Platelets 227 150 - 400 K/uL   nRBC 0.0 0.0 - 0.2 %    Comment: Performed at [facility], [place] [address], [place], [place] [address]   Ct Abdomen Pelvis W Contrast  Result Date: 02/17/2019 CLINICAL DATA:  Left upper quadrant pain. History of multiple small bowel obstructions. EXAM: CT ABDOMEN AND PELVIS WITH CONTRAST TECHNIQUE: Multidetector CT imaging of the abdomen and pelvis was performed using the standard protocol following bolus administration of intravenous contrast.  CONTRAST:  161mL OMNIPAQUE IOHEXOL 300 MG/ML  SOLN COMPARISON:  CT abdomen pelvis 10/26/2018 FINDINGS: LOWER CHEST: There is no basilar pleural or apical pericardial effusion. HEPATOBILIARY: The hepatic contours and density are normal. There is no intra- or extrahepatic biliary dilatation. The gallbladder is normal. PANCREAS: The pancreatic parenchymal contours are normal and there is no ductal dilatation. There is no peripancreatic fluid collection. SPLEEN: Normal. ADRENALS/URINARY TRACT: --Adrenal glands: Normal. --Right kidney/ureter: No hydronephrosis, nephroureterolithiasis, perinephric stranding or solid renal mass. --Left kidney/ureter: No hydronephrosis, nephroureterolithiasis, perinephric stranding or solid renal mass. --Urinary bladder: Normal for degree of distention STOMACH/BOWEL: --Stomach/Duodenum: There is no hiatal hernia or other gastric abnormality. The duodenal course and caliber are normal. --Small bowel: Mildly dilated, fluid-filled small bowel in the left hemiabdomen. Suspected transition point in the ventral midline abdomen (series 3, image 42). --Colon: No focal abnormality. --Appendix: Normal. VASCULAR/LYMPHATIC: Normal course and caliber of the major abdominal vessels. No abdominal or pelvic lymphadenopathy. REPRODUCTIVE: Normal prostate size with symmetric seminal vesicles. MUSCULOSKELETAL. No bony spinal canal stenosis or focal osseous abnormality. OTHER: None. IMPRESSION: Mildly  dilated and fluid-filled small bowel predominantly within the left upper quadrant with a suspected transition point in the ventral midline abdomen. Electronically Signed   By: Ulyses Jarred M.D.   On: 02/17/2019 06:06   Dg Abd Portable 1 View  Result Date: 02/17/2019 CLINICAL DATA:  Abdominal pain. NG tube placement. EXAM: PORTABLE ABDOMEN - 1 VIEW COMPARISON:  CT abdomen from earlier same day. FINDINGS: Nasogastric tube appears adequately positioned in the stomach. Paucity of bowel gas. IMPRESSION: Nasogastric tube appears adequately positioned in the stomach. Electronically Signed   By: Franki Cabot M.D.   On: 02/17/2019 07:31      Assessment/Plan Psoriasis Hypertension - IV meds PRN while NPO  SBO - h/o laparoscopic umbilical hernia repair with mesh 10/05/2015 by Dr. Rosendo Gros - multiple SBO's in the past requiring admission, most recently 10/2018 and 02/2018 - Patient is nontoxic appearing, WBC WNL. Will admit to med-surg. NG tube in place. Start small bowel protocol.  ID - none VTE - SCDs, lovenox FEN - IVF, NPO/NGT Foley - none Follow up - TBD Covid - pending  [place]ellington [place], [facility] Surgery 02/17/2019, 7:43 AM Pager: [phone] Mon-Thurs 7:00 am-4:30 pm Fri 7:00 am -11:30 AM Sat-Sun 7:00 am-11:30 am

## 2019-02-17 NOTE — ED Notes (Signed)
Patient transported to CT 

## 2019-02-17 NOTE — ED Provider Notes (Signed)
[facility] EMERGENCY DEPARTMENT Provider Note  CSN: [id number] Arrival date & time: 02/17/19 0406  Chief Complaint(s) Abdominal Pain  HPI Jermaine Nam. is a 53 y.o. male with a history of recurrent small bowel obstructions secondary to surgery for incarcerated periumbilical hernia who presents to the emergency department with approximately 8 hours of gradually worsening periumbilical and epigastric pain described as constant aching with cramping features similar to prior presentations for bowel obstruction.  He is also endorsing nausea with nonbloody nonbilious emesis.  Reports no bowel movements since yesterday afternoon.  Does report small flatus.  Pain worse with palpation and emesis.  No alleviating factors.  No suspicious food intake.  No recent alcohol consumption.  No fevers or chills.  Denies any other physical complaints at this time.  HPI  Past Medical History Past Medical History:  Diagnosis Date  . Hypertension    treated in past. No longer on medications (02/19/2018)  . Pneumonia 1990s X 1  . Psoriasis    Danella Sensing, MD dermatology  . Small bowel obstruction ([place]) 02/19/2018   Patient Active Problem List   Diagnosis Date Noted  . Small bowel obstruction ([place]) 10/26/2018  . SBO (small bowel obstruction) ([place]) 02/19/2018  . Psoriasis 09/30/2011  . HYPERTENSION 01/22/2007   Home Medication(s) Prior to Admission medications   Medication Sig Start Date End Date Taking? Authorizing Provider  amLODipine (NORVASC) 5 MG tablet Take 5 mg by mouth at bedtime.  10/11/18  Yes [provider]  triamterene-hydrochlorothiazide (DYAZIDE) 37.5-25 MG capsule Take 1 capsule by mouth at bedtime.  10/12/18  Yes [provider]                                                                                                                                    Past Surgical History Past Surgical History:  Procedure Laterality Date  .  ESOPHAGOGASTRODUODENOSCOPY (EGD) WITH ESOPHAGEAL DILATION  1976  . HERNIA REPAIR    . INSERTION OF MESH N/A 10/05/2015   Procedure: INSERTION OF MESH;  Surgeon: Ralene Ok, MD;  Location: [facility];  Service: General;  Laterality: N/A;  . LASIK Bilateral 2003  . UMBILICAL HERNIA REPAIR N/A 10/05/2015   Procedure: LAPAROSCOPIC UMBILICAL HERNIA REPAIR WITH MESH;  Surgeon: Ralene Ok, MD;  Location: [facility];  Service: General;  Laterality: N/A;   Family History Family History  Problem Relation Age of Onset  . Cancer Mother        lung, smoker  . Heart disease Father        62, mid to late 66s first bypass. grandfather died at 72.   . Diabetes Father   . Cancer Sister        lung, smoker  . Colon cancer Neg Hx   . Esophageal cancer Neg Hx   . Liver cancer Neg Hx   . Pancreatic cancer Neg Hx   . Rectal cancer Neg  Hx   . Stomach cancer Neg Hx     Social History Social History   Tobacco Use  . Smoking status: Never Smoker  . Smokeless tobacco: Former Systems developer    Types: Snuff  Substance Use Topics  . Alcohol use: Yes    Alcohol/week: 8.0 standard drinks    Types: 8 Cans of beer per week  . Drug use: Never   Allergies Doxycycline hyclate  Review of Systems Review of Systems All other systems are reviewed and are negative for acute change except as noted in the HPI  Physical Exam Vital Signs  I have reviewed the triage vital signs BP (!) 134/93   Pulse 78   Temp 97.9 F (36.6 C) (Oral)   Resp 20   SpO2 97%   Physical Exam Vitals signs reviewed.  Constitutional:      General: He is not in acute distress.    Appearance: He is well-developed. He is not diaphoretic.  HENT:     Head: Normocephalic and atraumatic.     Jaw: No trismus.     Right Ear: External ear normal.     Left Ear: External ear normal.     Nose: Nose normal.  Eyes:     General: No scleral icterus.    Conjunctiva/sclera: Conjunctivae normal.  Neck:     Musculoskeletal: Normal range of motion.      Trachea: Phonation normal.  Cardiovascular:     Rate and Rhythm: Normal rate and regular rhythm.  Pulmonary:     Effort: Pulmonary effort is normal. No respiratory distress.     Breath sounds: No stridor.  Abdominal:     General: There is distension.     Tenderness: There is abdominal tenderness in the epigastric area and periumbilical area. There is no guarding or rebound.  Musculoskeletal: Normal range of motion.  Neurological:     Mental Status: He is alert and oriented to person, place, and time.  Psychiatric:        Behavior: Behavior normal.     ED Results and Treatments Labs (all labs ordered are listed, but only abnormal results are displayed) Labs Reviewed  COMPREHENSIVE METABOLIC PANEL - Abnormal; Notable for the following components:      Result Value   Glucose, Bld 140 (*)    All other components within normal limits  NOVEL CORONAVIRUS, NAA (HOSPITAL ORDER, SEND-OUT TO REF LAB)  LIPASE, BLOOD  CBC  URINALYSIS, ROUTINE W REFLEX MICROSCOPIC                                                                                                                         EKG  EKG Interpretation  Date/Time:    Ventricular Rate:    PR Interval:    QRS Duration:   QT Interval:    QTC Calculation:   R Axis:     Text Interpretation:        Radiology Ct Abdomen Pelvis W Contrast  Result Date: 02/17/2019 CLINICAL  DATA:  Left upper quadrant pain. History of multiple small bowel obstructions. EXAM: CT ABDOMEN AND PELVIS WITH CONTRAST TECHNIQUE: Multidetector CT imaging of the abdomen and pelvis was performed using the standard protocol following bolus administration of intravenous contrast. CONTRAST:  128mL OMNIPAQUE IOHEXOL 300 MG/ML  SOLN COMPARISON:  CT abdomen pelvis 10/26/2018 FINDINGS: LOWER CHEST: There is no basilar pleural or apical pericardial effusion. HEPATOBILIARY: The hepatic contours and density are normal. There is no intra- or extrahepatic biliary dilatation. The  gallbladder is normal. PANCREAS: The pancreatic parenchymal contours are normal and there is no ductal dilatation. There is no peripancreatic fluid collection. SPLEEN: Normal. ADRENALS/URINARY TRACT: --Adrenal glands: Normal. --Right kidney/ureter: No hydronephrosis, nephroureterolithiasis, perinephric stranding or solid renal mass. --Left kidney/ureter: No hydronephrosis, nephroureterolithiasis, perinephric stranding or solid renal mass. --Urinary bladder: Normal for degree of distention STOMACH/BOWEL: --Stomach/Duodenum: There is no hiatal hernia or other gastric abnormality. The duodenal course and caliber are normal. --Small bowel: Mildly dilated, fluid-filled small bowel in the left hemiabdomen. Suspected transition point in the ventral midline abdomen (series 3, image 42). --Colon: No focal abnormality. --Appendix: Normal. VASCULAR/LYMPHATIC: Normal course and caliber of the major abdominal vessels. No abdominal or pelvic lymphadenopathy. REPRODUCTIVE: Normal prostate size with symmetric seminal vesicles. MUSCULOSKELETAL. No bony spinal canal stenosis or focal osseous abnormality. OTHER: None. IMPRESSION: Mildly dilated and fluid-filled small bowel predominantly within the left upper quadrant with a suspected transition point in the ventral midline abdomen. Electronically Signed   By: Ulyses Jarred M.D.   On: 02/17/2019 06:06    Pertinent labs & imaging results that were available during my care of the patient were reviewed by me and considered in my medical decision making (see chart for details).  Medications Ordered in ED Medications  HYDROmorphone (DILAUDID) injection 0.5 mg (has no administration in time range)  sodium chloride flush (NS) 0.9 % injection 3 mL (3 mLs Intravenous Given 02/17/19 0430)  ondansetron (ZOFRAN) injection 4 mg (4 mg Intravenous Given 02/17/19 0430)  HYDROmorphone (DILAUDID) injection 1 mg (1 mg Intravenous Given 02/17/19 0543)  sodium chloride 0.9 % bolus 1,000 mL (1,000  mLs Intravenous New Bag/Given 02/17/19 0605)  ondansetron (ZOFRAN) injection 4 mg (4 mg Intravenous Given 02/17/19 0544)  iohexol (OMNIPAQUE) 300 MG/ML solution 100 mL (100 mLs Intravenous Contrast Given 02/17/19 0551)                                                                                                                                    Procedures Procedures  (including critical care time)  Medical Decision Making / ED Course I have reviewed the nursing notes for this encounter and the patient's prior records (if available in EHR or on provided paperwork).  CT confirm bowel obstruction with transition point in the ventral region.  Labs reassuring.  Patient provided with IV pain and nausea medicine.  Also given IV fluids.  NGT inserted.  Case discussed with surgery for  admission.      Final Clinical Impression(s) / ED Diagnoses Final diagnoses:  SBO (small bowel obstruction) ([place])      This chart was dictated using voice recognition software.  Despite best efforts to proofread,  errors can occur which can change the documentation meaning.   Fatima Blank, MD 02/17/19 [id number]

## 2019-02-17 NOTE — ED Notes (Signed)
ED TO INPATIENT HANDOFF REPORT  ED Nurse Name and Phone #:  Anette Barra [phone]  S Name/Age/Gender Jermaine Walls. 53 y.o. male Room/Bed: 021C/021C  Code Status   Code Status: Full Code  Home/SNF/Other Home Patient oriented to: self, place, time and situation Is this baseline? Yes   Triage Complete: Triage complete  Chief Complaint ABD PAIN   Triage Note Pt c/o LUQ pain; hx of SBO x3 that have required admission. NV, able to pass gas, has not had a BM    Allergies Allergies  Allergen Reactions  . Doxycycline Hyclate Itching and Rash    Level of Care/Admitting Diagnosis ED Disposition    ED Disposition Condition Comment   Admit  Hospital Area: [facility] [[id number]]  Level of Care: Med-Surg [16]  Covid Evaluation: N/A  Diagnosis: SBO (small bowel obstruction) ([facility]) [993570]  Admitting Physician: CCS, [place]  Attending Physician: CCS, MD [[id number]]  Bed request comments: 6N  PT Class (Do Not Modify): Observation [104]  PT Acc Code (Do Not Modify): Observation [10022]       B Medical/Surgery History Past Medical History:  Diagnosis Date  . Hypertension    treated in past. No longer on medications (02/19/2018)  . Pneumonia 1990s X 1  . Psoriasis    Danella Sensing, MD dermatology  . Small bowel obstruction ([facility]) 02/19/2018   Past Surgical History:  Procedure Laterality Date  . ESOPHAGOGASTRODUODENOSCOPY (EGD) WITH ESOPHAGEAL DILATION  1976  . HERNIA REPAIR    . INSERTION OF MESH N/A 10/05/2015   Procedure: INSERTION OF MESH;  Surgeon: Ralene Ok, MD;  Location: [facility];  Service: General;  Laterality: N/A;  . LASIK Bilateral 2003  . UMBILICAL HERNIA REPAIR N/A 10/05/2015   Procedure: LAPAROSCOPIC UMBILICAL HERNIA REPAIR WITH MESH;  Surgeon: Ralene Ok, MD;  Location: [facility];  Service: General;  Laterality: N/A;     A IV Location/Drains/Wounds Patient Lines/Drains/Airways Status   Active Line/Drains/Airways    Name:   Placement date:    Placement time:   Site:   Days:   Peripheral IV 02/17/19 Left Antecubital   02/17/19    0425    Antecubital   less than 1   NG/OG Tube Nasogastric 18 Fr. Left nare Documented cm marking at nare/ corner of mouth   02/17/19    0648    Left nare   less than 1   Incision (Closed) 10/05/15 Abdomen Other (Comment)   10/05/15    0837     1231          Intake/Output Last 24 hours No intake or output data in the 24 hours ending 02/17/19 1779  Labs/Imaging Results for orders placed or performed during the hospital encounter of 02/17/19 (from the past 48 hour(s))  Lipase, blood     Status: None   Collection Time: 02/17/19  4:25 AM  Result Value Ref Range   Lipase 29 11 - 51 U/L    Comment: Performed at [facility], [facility] [address], [place], [place] [address]  Comprehensive metabolic panel     Status: Abnormal   Collection Time: 02/17/19  4:25 AM  Result Value Ref Range   Sodium 139 135 - 145 mmol/L   Potassium 4.4 3.5 - 5.1 mmol/L   Chloride 102 98 - 111 mmol/L   CO2 22 22 - 32 mmol/L   Glucose, Bld 140 (H) 70 - 99 mg/dL   BUN 15 6 - 20 mg/dL   Creatinine, Ser  1.10 0.61 - 1.24 mg/dL   Calcium 9.2 8.9 - 10.3 mg/dL   Total Protein 6.7 6.5 - 8.1 g/dL   Albumin 4.1 3.5 - 5.0 g/dL   AST 28 15 - 41 U/L   ALT 35 0 - 44 U/L   Alkaline Phosphatase 51 38 - 126 U/L   Total Bilirubin 0.6 0.3 - 1.2 mg/dL   GFR calc non Af Amer >60 >60 mL/min   GFR calc Af Amer >60 >60 mL/min   Anion gap 15 5 - 15    Comment: Performed at [facility], [place] [address] [place], [place] [address]  CBC     Status: None   Collection Time: 02/17/19  4:25 AM  Result Value Ref Range   WBC 10.0 4.0 - 10.5 K/uL   RBC 5.14 4.22 - 5.81 MIL/uL   Hemoglobin 16.0 13.0 - 17.0 g/dL   HCT 46.2 39.0 - 52.0 %   MCV 89.9 80.0 - 100.0 fL   MCH 31.1 26.0 - 34.0 pg   MCHC 34.6 30.0 - 36.0 g/dL   RDW 12.3 11.5 - 15.5 %   Platelets 227 150 - 400 K/uL   nRBC 0.0 0.0 - 0.2 %    Comment: Performed at [facility], [place] [address] [place], [place] [address]   Ct Abdomen Pelvis W Contrast  Result Date: 02/17/2019 CLINICAL DATA:  Left upper quadrant pain. History of multiple small bowel obstructions. EXAM: CT ABDOMEN AND PELVIS WITH CONTRAST TECHNIQUE: Multidetector CT imaging of the abdomen and pelvis was performed using the standard protocol following bolus administration of intravenous contrast. CONTRAST:  187mL OMNIPAQUE IOHEXOL 300 MG/ML  SOLN COMPARISON:  CT abdomen pelvis 10/26/2018 FINDINGS: LOWER CHEST: There is no basilar pleural or apical pericardial effusion. HEPATOBILIARY: The hepatic contours and density are normal. There is no intra- or extrahepatic biliary dilatation. The gallbladder is normal. PANCREAS: The pancreatic parenchymal contours are normal and there is no ductal dilatation. There is no peripancreatic fluid collection. SPLEEN: Normal. ADRENALS/URINARY TRACT: --Adrenal glands: Normal. --Right kidney/ureter: No hydronephrosis, nephroureterolithiasis, perinephric stranding or solid renal mass. --Left kidney/ureter: No hydronephrosis, nephroureterolithiasis, perinephric stranding or solid renal mass. --Urinary bladder: Normal for degree of distention STOMACH/BOWEL: --Stomach/Duodenum: There is no hiatal hernia or other gastric abnormality. The duodenal course and caliber are normal. --Small bowel: Mildly dilated, fluid-filled small bowel in the left hemiabdomen. Suspected transition point in the ventral midline abdomen (series 3, image 42). --Colon: No focal abnormality. --Appendix: Normal. VASCULAR/LYMPHATIC: Normal course and caliber of the major abdominal vessels. No abdominal or pelvic lymphadenopathy. REPRODUCTIVE: Normal prostate size with symmetric seminal vesicles. MUSCULOSKELETAL. No bony spinal canal stenosis or focal osseous abnormality. OTHER: None. IMPRESSION: Mildly dilated and fluid-filled small bowel predominantly within the left upper quadrant with a suspected transition point in the  ventral midline abdomen. Electronically Signed   By: Ulyses Jarred M.D.   On: 02/17/2019 06:06   Dg Abd Portable 1 View  Result Date: 02/17/2019 CLINICAL DATA:  Abdominal pain. NG tube placement. EXAM: PORTABLE ABDOMEN - 1 VIEW COMPARISON:  CT abdomen from earlier same day. FINDINGS: Nasogastric tube appears adequately positioned in the stomach. Paucity of bowel gas. IMPRESSION: Nasogastric tube appears adequately positioned in the stomach. Electronically Signed   By: Franki Cabot M.D.   On: 02/17/2019 07:31    Pending Labs Unresulted Labs (From admission, onward)    Start     Ordered   02/24/19 0500  Creatinine, serum  (enoxaparin (LOVENOX)  CrCl >/= 30 ml/min)  Weekly,   R    Comments: while on enoxaparin therapy    02/17/19 0742   02/17/19 0740  HIV antibody (Routine Testing)  Once,   STAT     02/17/19 0742   02/17/19 0740  CBC  (enoxaparin (LOVENOX)    CrCl >/= 30 ml/min)  Once,   STAT    Comments: Baseline for enoxaparin therapy IF NOT ALREADY DRAWN.  Notify MD if PLT < 100 K.    02/17/19 0742   02/17/19 8185  Novel Coronavirus,NAA,(SEND-OUT TO REF LAB - TAT 24-48 hrs); Hosp Order  (Asymptomatic Patients Labs)  Once,   STAT    Question:  Rule Out  Answer:  Yes   02/17/19 0623   02/17/19 0425  Urinalysis, Routine w reflex microscopic  ONCE - STAT,   STAT     02/17/19 0424          Vitals/Pain Today's Vitals   02/17/19 0530 02/17/19 0635 02/17/19 0715 02/17/19 0721  BP: (!) 134/93  (!) 141/93   Pulse: 78  68   Resp:   18   Temp:      TempSrc:      SpO2: 97%  98%   PainSc:  8  8  8      Isolation Precautions No active isolations  Medications Medications  enoxaparin (LOVENOX) injection 40 mg (has no administration in time range)  0.9 % NaCl with KCl 20 mEq/ L  infusion (has no administration in time range)  acetaminophen (TYLENOL) tablet 650 mg (has no administration in time range)    Or  acetaminophen (TYLENOL) suppository 650 mg (has no administration in time  range)  hydrALAZINE (APRESOLINE) injection 10 mg (has no administration in time range)  pantoprazole (PROTONIX) injection 40 mg (has no administration in time range)  simethicone (MYLICON) chewable tablet 40 mg (has no administration in time range)  ondansetron (ZOFRAN-ODT) disintegrating tablet 4 mg (has no administration in time range)    Or  ondansetron (ZOFRAN) injection 4 mg (has no administration in time range)  promethazine (PHENERGAN) injection 12.5 mg (has no administration in time range)  diphenhydrAMINE (BENADRYL) capsule 25 mg (has no administration in time range)    Or  diphenhydrAMINE (BENADRYL) injection 25 mg (has no administration in time range)  morphine 2 MG/ML injection 1 mg (has no administration in time range)  diatrizoate meglumine-sodium (GASTROGRAFIN) 66-10 % solution 90 mL (has no administration in time range)  sodium chloride flush (NS) 0.9 % injection 3 mL (3 mLs Intravenous Given 02/17/19 0430)  ondansetron (ZOFRAN) injection 4 mg (4 mg Intravenous Given 02/17/19 0430)  HYDROmorphone (DILAUDID) injection 1 mg (1 mg Intravenous Given 02/17/19 0543)  sodium chloride 0.9 % bolus 1,000 mL (1,000 mLs Intravenous New Bag/Given 02/17/19 0605)  ondansetron (ZOFRAN) injection 4 mg (4 mg Intravenous Given 02/17/19 0544)  iohexol (OMNIPAQUE) 300 MG/ML solution 100 mL (100 mLs Intravenous Contrast Given 02/17/19 0551)  HYDROmorphone (DILAUDID) injection 0.5 mg (0.5 mg Intravenous Given 02/17/19 0635)  HYDROmorphone (DILAUDID) injection 0.5 mg (0.5 mg Intravenous Given 02/17/19 0720)    Mobility walks Low fall risk   Focused Assessments  R Recommendations: See Admitting Provider Note  Report given to:   Additional Notes:

## 2019-02-18 ENCOUNTER — Observation Stay (HOSPITAL_COMMUNITY): Payer: No Typology Code available for payment source

## 2019-02-18 LAB — CBC
HCT: 40.9 % (ref 39.0–52.0)
Hemoglobin: 13.5 g/dL (ref 13.0–17.0)
MCH: 31 pg (ref 26.0–34.0)
MCHC: 33 g/dL (ref 30.0–36.0)
MCV: 93.8 fL (ref 80.0–100.0)
Platelets: 169 10*3/uL (ref 150–400)
RBC: 4.36 MIL/uL (ref 4.22–5.81)
RDW: 12.8 % (ref 11.5–15.5)
WBC: 7.4 10*3/uL (ref 4.0–10.5)
nRBC: 0 % (ref 0.0–0.2)

## 2019-02-18 LAB — BASIC METABOLIC PANEL
Anion gap: 8 (ref 5–15)
BUN: 21 mg/dL — ABNORMAL HIGH (ref 6–20)
CO2: 26 mmol/L (ref 22–32)
Calcium: 8.3 mg/dL — ABNORMAL LOW (ref 8.9–10.3)
Chloride: 108 mmol/L (ref 98–111)
Creatinine, Ser: 1.41 mg/dL — ABNORMAL HIGH (ref 0.61–1.24)
GFR calc Af Amer: >60 mL/min (ref >60)
GFR calc non Af Amer: 57 mL/min — ABNORMAL LOW (ref >60)
Glucose, Bld: 98 mg/dL (ref 70–99)
Potassium: 3.8 mmol/L (ref 3.5–5.1)
Sodium: 142 mmol/L (ref 135–145)

## 2019-02-18 LAB — HIV ANTIBODY (ROUTINE TESTING W REFLEX): HIV Screen 4th Generation wRfx: NONREACTIVE

## 2019-02-18 MED ORDER — DIATRIZOATE MEGLUMINE & SODIUM 66-10 % PO SOLN: 90.0000 mL | Freq: Once | ORAL | Status: DC

## 2019-02-18 NOTE — Progress Notes (Signed)
Patient discharged to home. Verbalized understanding of all discharge instructions including discharge medications and follow up MD visits. Patient left unit independently.

## 2019-02-18 NOTE — Discharge Instructions (Signed)
Bowel Obstruction A bowel obstruction is a blockage in the small or large bowel. The bowel, which is also called the intestine, is a long, slender tube that connects the stomach to the anus. When a person eats and drinks, food and fluids go from the mouth to the stomach to the small bowel. This is where most of the nutrients in the food and fluids are absorbed. After the small bowel, material passes through the large bowel for further absorption until any leftover material leaves the body as stool through the anus during a bowel movement. A bowel obstruction will prevent food and fluids from passing through the bowel as they normally do during digestion. The bowel can become partially or completely blocked. If this condition is not treated, it can be dangerous because the bowel could rupture. What are the causes? Common causes of this condition include:  Scar tissue (adhesions) from previous surgery or treatment with high-energy X-rays (radiation).  Recent surgery. This may cause the movements of the bowel to slow down and cause food to block the intestine.  Inflammatory bowel disease, such as Crohn's disease or diverticulitis.  Growths or tumors.  A bulging organ (hernia).  Twisting of the bowel (volvulus).  A foreign body.  Slipping of a part of the bowel into another part (intussusception). What are the signs or symptoms? Symptoms of this condition include:  Pain in the abdomen. Depending on the degree of obstruction, pain may be: ? Mild or severe. ? Dull cramping or sharp pain. ? In one area or in the entire abdomen.  Nausea and vomiting. Vomit may be greenish or a yellow bile color.  Bloating in the abdomen.  Difficulty passing stool (constipation).  Lack of passing gas.  Frequent belching.  Diarrhea. This may occur if the obstruction is partial and runny stool is able to leak around the obstruction. How is this diagnosed? This condition may be diagnosed based on:  A  physical exam.  Medical history.  Imaging tests of the abdomen or pelvis, such as X-ray or CT scan.  Blood or urine tests. How is this treated? Treatment for this condition depends on the cause and severity of the problem. Treatment may include:  Fluids and pain medicines that are given through an IV. Your health care provider may instruct you not to eat or drink if you have nausea or vomiting.  Eating a simple diet. You may be asked to consume a clear liquid diet for several days. This allows the bowel to rest.  Placement of a small tube (nasogastric tube) into the stomach. This will relieve pain, discomfort, and nausea by removing blocked air and fluids from the stomach. It can also help the obstruction clear up faster.  Surgery. This may be required if other treatments do not work. Surgery may be required for: ? Bowel obstruction from a hernia. This can be an emergency procedure. ? Scar tissue that causes frequent or severe obstructions. Follow these instructions at home: Medicines  Take over-the-counter and prescription medicines only as told by your health care provider.  If you were prescribed an antibiotic medicine, take it as told by your health care provider. Do not stop taking the antibiotic even if you start to feel better. General instructions  Follow instructions from your health care provider about eating restrictions. You may need to avoid solid foods and consume only clear liquids until your condition improves.  Return to your normal activities as told by your health care provider. Ask your health care  provider what activities are safe for you. °· Avoid sitting for a long time without moving. Get up to take short walks every 1-2 hours. This is important to improve blood flow and breathing. Ask for help if you feel weak or unsteady. °· Keep all follow-up visits as told by your health care provider. This is important. °How is this prevented? °After having a bowel  obstruction, you are more likely to have another. You may do the following things to prevent another obstruction: °· If you have a long-term (chronic) disease, pay attention to your symptoms and contact your health care provider if you have questions or concerns. °· Avoid becoming constipated. To prevent or treat constipation, your health care provider may recommend that you: °? Drink enough fluid to keep your urine pale yellow. °? Take over-the-counter or prescription medicines. °? Eat foods that are high in fiber, such as beans, whole grains, and fresh fruits and vegetables. °? Limit foods that are high in fat and processed sugars, such as fried or sweet foods. °· Stay active. Exercise for 30 minutes or more, 5 or more days each week. Ask your health care provider which exercises are safe for you. °· Avoid stress. Find ways to reduce stress, such as meditation, exercise, or taking time for activities that relax you. °· Instead of eating three large meals each day, eat three small meals with three small snacks. °· Work with a dietitian to make a healthy meal plan that works for you. °· Do not use any products that contain nicotine or tobacco, such as cigarettes and e-cigarettes. If you need help quitting, ask your health care provider. °Contact a health care provider if you: °· Have a fever. °· Have chills. °Get help right away if you: °· Have increased pain or cramping. °· Vomit blood. °· Have uncontrolled vomiting or nausea. °· Cannot drink fluids because of vomiting or pain. °· Become confused. °· Begin feeling very thirsty (dehydrated). °· Have severe bloating. °· Feel extremely weak or you faint. °Summary °· A bowel obstruction is a blockage in the small or large bowel. °· A bowel obstruction will prevent food and fluids from passing through the bowel as they normally do during digestion. °· Treatment for this condition depends on the cause and severity of the problem. It may include fluids and pain medicines  through an IV, a simple diet, a nasogastric tube, or surgery. °· Follow instructions from your health care provider about eating restrictions. You may need to avoid solid foods and consume only clear liquids until your condition improves. °This information is not intended to replace advice given to you by your health care provider. Make sure you discuss any questions you have with your health care provider. °Document Released: 10/25/2005 Document Revised: 09/14/2018 Document Reviewed: 12/20/2017 °Elsevier Patient Education © 2020 [employer] ° °

## 2019-02-18 NOTE — Progress Notes (Signed)
Orders to discontinue NG tube, total output this shift 838ml. Ng tube removed from suction and discontinued. Patient tolerated well and will continue to monitor for remainder of shift.

## 2019-02-18 NOTE — Progress Notes (Signed)
Subjective/Chief Complaint: Some flatus, no stool, feels better but not yet back to normal   Objective: Vital signs in last 24 hours: Temp:  [98.1 F (36.7 C)-98.7 F (37.1 C)] 98.1 F (36.7 C) (06/29 0456) Pulse Rate:  [65-78] 65 (06/29 0456) Resp:  [16-18] 16 (06/29 0456) BP: (122-139)/(80-122) 122/81 (06/29 0456) SpO2:  [95 %-99 %] 99 % (06/29 0456) Last BM Date: 02/17/19  Intake/Output from previous day: 06/28 0701 - 06/29 0700 In: 823.7 [I.V.:783.7; NG/GT:40] Out: 2500 [Urine:100; Emesis/NG output:2400] Intake/Output this shift: No intake/output data recorded.  GI: mild distended nontender some bs present  Lab Results:  Recent Labs    02/17/19 0425 02/18/19 0555  WBC 10.0 7.4  HGB 16.0 13.5  HCT 46.2 40.9  PLT 227 169   BMET Recent Labs    02/17/19 0425 02/18/19 0555  NA 139 142  K 4.4 3.8  CL 102 108  CO2 22 26  GLUCOSE 140* 98  BUN 15 21*  CREATININE 1.10 1.41*  CALCIUM 9.2 8.3*   PT/INR No results for input(s): LABPROT, INR in the last 72 hours. ABG No results for input(s): PHART, HCO3 in the last 72 hours.  Invalid input(s): PCO2, PO2  Studies/Results: Ct Abdomen Pelvis W Contrast  Result Date: 02/17/2019 CLINICAL DATA:  Left upper quadrant pain. History of multiple small bowel obstructions. EXAM: CT ABDOMEN AND PELVIS WITH CONTRAST TECHNIQUE: Multidetector CT imaging of the abdomen and pelvis was performed using the standard protocol following bolus administration of intravenous contrast. CONTRAST:  150mL OMNIPAQUE IOHEXOL 300 MG/ML  SOLN COMPARISON:  CT abdomen pelvis 10/26/2018 FINDINGS: LOWER CHEST: There is no basilar pleural or apical pericardial effusion. HEPATOBILIARY: The hepatic contours and density are normal. There is no intra- or extrahepatic biliary dilatation. The gallbladder is normal. PANCREAS: The pancreatic parenchymal contours are normal and there is no ductal dilatation. There is no peripancreatic fluid collection. SPLEEN:  Normal. ADRENALS/URINARY TRACT: --Adrenal glands: Normal. --Right kidney/ureter: No hydronephrosis, nephroureterolithiasis, perinephric stranding or solid renal mass. --Left kidney/ureter: No hydronephrosis, nephroureterolithiasis, perinephric stranding or solid renal mass. --Urinary bladder: Normal for degree of distention STOMACH/BOWEL: --Stomach/Duodenum: There is no hiatal hernia or other gastric abnormality. The duodenal course and caliber are normal. --Small bowel: Mildly dilated, fluid-filled small bowel in the left hemiabdomen. Suspected transition point in the ventral midline abdomen (series 3, image 42). --Colon: No focal abnormality. --Appendix: Normal. VASCULAR/LYMPHATIC: Normal course and caliber of the major abdominal vessels. No abdominal or pelvic lymphadenopathy. REPRODUCTIVE: Normal prostate size with symmetric seminal vesicles. MUSCULOSKELETAL. No bony spinal canal stenosis or focal osseous abnormality. OTHER: None. IMPRESSION: Mildly dilated and fluid-filled small bowel predominantly within the left upper quadrant with a suspected transition point in the ventral midline abdomen. Electronically Signed   By: Ulyses Jarred M.D.   On: 02/17/2019 06:06   Dg Abd Portable 1v-small Bowel Obstruction Protocol-initial, 8 Hr Delay  Result Date: 02/17/2019 CLINICAL DATA:  Small bowel obstruction EXAM: PORTABLE ABDOMEN - 1 VIEW COMPARISON:  02/17/2019 FINDINGS: NG tube tip is in the proximal stomach. Dilated left abdominal small bowel loops are again noted compatible with small bowel obstruction. Oral contrast material noted within the left abdominal small bowel loops. No visible contrast passage into the colon. No free air or organomegaly. IMPRESSION: Bowel-gas pattern compatible with small bowel obstruction. NG tube tip in the proximal stomach. Electronically Signed   By: Rolm Baptise M.D.   On: 02/17/2019 19:23   Dg Abd Portable 1 View  Result Date: 02/17/2019 CLINICAL  DATA:  Abdominal pain. NG  tube placement. EXAM: PORTABLE ABDOMEN - 1 VIEW COMPARISON:  CT abdomen from earlier same day. FINDINGS: Nasogastric tube appears adequately positioned in the stomach. Paucity of bowel gas. IMPRESSION: Nasogastric tube appears adequately positioned in the stomach. Electronically Signed   By: Franki Cabot M.D.   On: 02/17/2019 07:31    Anti-infectives: Anti-infectives (From admission, onward)   None      Assessment/Plan: SBO - h/o laparoscopic umbilical hernia repair with mesh 10/05/2015 by Dr. Rosendo Gros - multiple SBO's in the past requiring admission, most recently 10/2018 and 02/2018 - clinically appears resolving, will check film this am and hopefully dc ng and start liquids -we discussed there might be role electively of doing dx lsc if he avoids surgery this episode as is uncommon to have continued bowel obstructions like this ID - none VTE - SCDs, lovenox FEN - IVF, NPO/NGT, cr up this am to 1.41, will continue hydration and recheck, likely combination of deyhdration and contrast Foley - none Follow up - TBD Covid - pending Psoriasis Hypertension - IV meds PRN while NPO Rolm Bookbinder 02/18/2019

## 2019-02-19 LAB — NOVEL CORONAVIRUS, NAA (HOSP ORDER, SEND-OUT TO REF LAB; TAT 18-24 HRS): SARS-CoV-2, NAA: NOT DETECTED

## 2019-02-19 NOTE — Discharge Summary (Signed)
[facility] Discharge Summary   Patient ID: Rodman Key. MRN: [id number] DOB/AGE: 53-22-1967 53 y.o.  Admit date: 02/17/2019 Discharge date: 02/18/2019  Admitting Diagnosis: SBO  Discharge Diagnosis Patient Active Problem List   Diagnosis Date Noted  . Small bowel obstruction ([place]) 10/26/2018  . SBO (small bowel obstruction) ([place]) 02/19/2018  . Psoriasis 09/30/2011  . HYPERTENSION 01/22/2007    Consultants None  Imaging: Dg Abd Portable 1v-small Bowel Obstruction Protocol-24 Hr Delay  Result Date: 02/18/2019 CLINICAL DATA:  Small bowel obstruction. EXAM: PORTABLE ABDOMEN - 1 VIEW COMPARISON:  02/17/2019 FINDINGS: An enteric tube terminates in the region of the gastric cardia. Oral contrast material has progressed distally and is now present throughout the colon. A small amount of gas is present in small bowel loops in the central and left abdomen, however dilated left-sided small bowel loops on the prior study are no longer present. No acute osseous abnormality is seen. IMPRESSION: Interval passage of contrast into the colon and resolution of small bowel dilatation. Electronically Signed   By: Logan Bores M.D.   On: 02/18/2019 09:18   Dg Abd Portable 1v-small Bowel Obstruction Protocol-initial, 8 Hr Delay  Result Date: 02/17/2019 CLINICAL DATA:  Small bowel obstruction EXAM: PORTABLE ABDOMEN - 1 VIEW COMPARISON:  02/17/2019 FINDINGS: NG tube tip is in the proximal stomach. Dilated left abdominal small bowel loops are again noted compatible with small bowel obstruction. Oral contrast material noted within the left abdominal small bowel loops. No visible contrast passage into the colon. No free air or organomegaly. IMPRESSION: Bowel-gas pattern compatible with small bowel obstruction. NG tube tip in the proximal stomach. Electronically Signed   By: Rolm Baptise M.D.   On: 02/17/2019 19:23    Procedures None  Hospital Course:  Paden Senger. is a 53yo  male PMH HTN, psoriasis, and h/o laparoscopic umbilical hernia repair with mesh 10/05/2015 by Dr. Rosendo Gros, who presented to [facility] 6/28 with acute onset abdominal pain and bloating last night after dinner. He has had multiple SBO's in the past requiring admission, most recently 10/2018 and 02/2018. ED workup included CT scan which shows mildly dilated and fluid-filled small bowel predominantly within the left upper quadrant with a suspected transition point in the ventral midline abdomen. WBC 10. VSS. Patient was admitted to the surgical service for NG tube decompression. He was started on the small bowel protocol and delayed film showed contrast in the colon. NG tube was removed and diet advanced as tolerated.  On 6/29 the patient was tolerating diet, having bowel function, ambulating well, vital signs stable and felt stable for discharge home.  Patient will follow up as below and knows to call with questions or concerns.      Allergies as of 02/18/2019      Reactions   Doxycycline Hyclate Itching, Rash      Medication List    TAKE these medications   amLODipine 5 MG tablet Commonly known as: NORVASC Take 5 mg by mouth at bedtime.   triamterene-hydrochlorothiazide 37.5-25 MG capsule Commonly known as: DYAZIDE Take 1 capsule by mouth at bedtime.        Follow-up Information    Rolm Bookbinder, MD Follow up in 1 month(s).   Specialty: General Surgery Contact information: [place] [address] [phone]           Signed: Wellington Hampshire, [facility] 02/19/2019, 1:31 PM Pager: [phone] Mon-Thurs 7:00 am-4:30 pm Fri 7:00 am -  11:30 AM Sat-Sun 7:00 am-11:30 am

## 2020-02-21 ENCOUNTER — Other Ambulatory Visit (HOSPITAL_COMMUNITY): Payer: Self-pay | Admitting: Family Medicine

## 2020-04-03 ENCOUNTER — Other Ambulatory Visit: Payer: Self-pay | Admitting: Radiology

## 2020-04-03 ENCOUNTER — Other Ambulatory Visit: Payer: No Typology Code available for payment source

## 2020-04-03 DIAGNOSIS — Z20822 Contact with and (suspected) exposure to covid-19: Secondary | ICD-10-CM

## 2020-04-04 LAB — NOVEL CORONAVIRUS, NAA: SARS-CoV-2, NAA: NOT DETECTED

## 2020-04-04 LAB — SARS-COV-2, NAA 2 DAY TAT

## 2020-04-17 ENCOUNTER — Other Ambulatory Visit (HOSPITAL_COMMUNITY): Payer: Self-pay | Admitting: Family Medicine

## 2020-04-24 ENCOUNTER — Ambulatory Visit: Payer: Self-pay | Admitting: Surgery

## 2020-04-24 NOTE — H&P (Signed)
History of Present Illness Jermaine Walls. Tsuei MD; 04/24/2020 12:54 PM) The patient is a 54 year old male who presents with small bowel obstruction. 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 In February 2017, he underwent laparoscopic repair of an incarcerated 1.5 cm umbilical hernia. This was repaired with an 11.4 cm Bard ventral light mesh. This was secured with 4 sutures and the secure strap tacker. Since that time, the patient has had several admissions for small bowel obstruction. These all resolved fairly quickly with nasogastric decompression. However recently, the patient has had 2 episodes within a month. He actually went to the emergency department in [place]. While he was waiting to be seen, he felt that his symptoms improved so he was never actually treated. However he is quite concerned about the episodes of recurrence and is obviously worried about complications of recurrent small bowel obstruction. His most recent CT scan was and June 2020 that showed a suspected transition point in the ventral midline abdomen. This is similar to the appearance of the CT scan in March 2020. He requested consultation regarding possible surgery for lysis of adhesions to prevent further bowel obstructions.  CLINICAL DATA: Left upper quadrant pain. History of multiple small bowel obstructions.  EXAM: CT ABDOMEN AND PELVIS WITH CONTRAST  TECHNIQUE: Multidetector CT imaging of the abdomen and pelvis was performed using the standard protocol following bolus administration of intravenous contrast.  CONTRAST: 194mL OMNIPAQUE IOHEXOL 300 MG/ML SOLN  COMPARISON: CT abdomen pelvis 10/26/2018  FINDINGS: LOWER CHEST: There is no basilar pleural or apical pericardial effusion.  HEPATOBILIARY: The hepatic contours and density are normal. There is no intra- or  extrahepatic biliary dilatation. The gallbladder is normal.  PANCREAS: The pancreatic parenchymal contours are normal and there is no ductal dilatation. There is no peripancreatic fluid collection.  SPLEEN: Normal.  ADRENALS/URINARY TRACT:  --Adrenal glands: Normal.  --Right kidney/ureter: No hydronephrosis, nephroureterolithiasis, perinephric stranding or solid renal mass.  --Left kidney/ureter: No hydronephrosis, nephroureterolithiasis, perinephric stranding or solid renal mass.  --Urinary bladder: Normal for degree of distention  STOMACH/BOWEL:  --Stomach/Duodenum: There is no hiatal hernia or other gastric abnormality. The duodenal course and caliber are normal.  --Small bowel: Mildly dilated, fluid-filled small bowel in the left hemiabdomen. Suspected transition point in the ventral midline abdomen (series 3, image 42).  --Colon: No focal abnormality.  --Appendix: Normal.  VASCULAR/LYMPHATIC: Normal course and caliber of the major abdominal vessels. No abdominal or pelvic lymphadenopathy.  REPRODUCTIVE: Normal prostate size with symmetric seminal vesicles.  MUSCULOSKELETAL. No bony spinal canal stenosis or focal osseous abnormality.  OTHER: None.  IMPRESSION: Mildly dilated and fluid-filled small bowel predominantly within the left upper quadrant with a suspected transition point in the ventral midline abdomen.   Electronically Signed By: Ulyses Jarred M.D. On: 02/17/2019 06:06    Problem List/Past Medical Rodman Key K. Tsuei, MD; 04/24/2020 12:54 PM) S/P HERNIA REPAIR (Z98.890) INTERMITTENT SMALL BOWEL OBSTRUCTION DUE TO ADHESIONS (K56.50)  Past Surgical History (Matthew K. Tsuei, MD; 04/24/2020 12:54 PM) No pertinent past surgical history  Diagnostic Studies History (Matthew K. Tsuei, MD; 04/24/2020 12:54 PM) Colonoscopy never  Allergies Darden Palmer, RMA; 04/24/2020 10:34 AM) Doxycycline Hyclate *Tetracyclines** Rash. Allergies  Reconciled  Medication History Darden Palmer, [place]; 04/24/2020 10:34 AM) amLODIPine Besylate (10MG  Tablet, Oral) Active. Allopurinol (300MG  Tablet, 600 Oral) Active. Medications Reconciled  Social History Jermaine Walls.  Tsuei, MD; 04/24/2020 12:54 PM) Alcohol use Moderate alcohol use. Caffeine use Coffee. No drug use Tobacco use Never smoker.  Family History Jermaine Walls. Tsuei, MD; 04/24/2020 12:54 PM) Arthritis Mother. Diabetes Mellitus Father. Heart Disease Father. Respiratory Condition Mother.  Other Problems Jermaine Walls. Tsuei, MD; 04/24/2020 12:54 PM) High blood pressure UMBILICAL HERNIA WITHOUT OBSTRUCTION AND WITHOUT GANGRENE (K42.9) [09/24/2015]:    Vitals Lattie Haw Caldwell RMA; 04/24/2020 10:35 AM) 04/24/2020 10:34 AM Weight: 252 lb Height: 70in Body Surface Area: 2.3 m Body Mass Index: 36.16 kg/m  Temp.: 97.55F  Pulse: 96 (Regular)  P.OX: 96% (Room air) BP: 122/80(Sitting, Left Arm, Standard)        Physical Exam Rodman Key K. Tsuei MD; 04/24/2020 12:55 PM)  The physical exam findings are as follows: Note:Constitutional: WDWN in NAD, conversant, no obvious deformities; resting comfortably Eyes: Pupils equal, round; sclera anicteric; moist conjunctiva; no lid lag HENT: Oral mucosa moist; good dentition Neck: No masses palpated, trachea midline; no thyromegaly Lungs: CTA bilaterally; normal respiratory effort CV: Regular rate and rhythm; no murmurs; extremities well-perfused with no edema Abd: +bowel sounds, soft, non-tender, no palpable organomegaly; healed laparoscopic incisions, upper midline rectus diastases, slight laxity at the umbilicus without a clear palpable hernia. Musc: Normal gait; no apparent clubbing or cyanosis in extremities Lymphatic: No palpable cervical or axillary lymphadenopathy Skin: Warm, dry; no sign of jaundice Psychiatric - alert and oriented x 4; calm mood and affect    Assessment & Plan Rodman Key K. Tsuei MD; 04/24/2020  12:55 PM)  INTERMITTENT SMALL BOWEL OBSTRUCTION DUE TO ADHESIONS (K56.50)  Current Plans Schedule for Surgery - Diagnostic laparoscopy with lysis of adhesions, possible exploratory laparotomy. The surgical procedure has been discussed with the patient. Potential risks, benefits, alternative treatments, and expected outcomes have been explained. All of the patient's questions at this time have been answered. The likelihood of reaching the patient's treatment goal is good. The patient understand the proposed surgical procedure and wishes to proceed. Note:Based on the appearance of the CT scan, it is possible that he has a loop of small bowel that has become adherent to the edge of his mesh. I offered him a diagnostic laparoscopy with the plan to lyse all the visible adhesions to clear the bowel and omentum away from the undersurface of the mesh. We will inspect the mesh to make sure that is in the appropriate place and there is no sign of recurrent hernia. There is a risk of need to convert to an open procedure but hopefully we can lyse all these adhesions with the laparoscope.  Jermaine Walls. Georgette Dover, MD, [facility] Surgery  General/ Trauma Surgery   04/24/2020 12:56 PM

## 2020-05-22 ENCOUNTER — Ambulatory Visit: Payer: No Typology Code available for payment source | Attending: Internal Medicine

## 2020-05-22 ENCOUNTER — Other Ambulatory Visit (HOSPITAL_BASED_OUTPATIENT_CLINIC_OR_DEPARTMENT_OTHER): Payer: Self-pay | Admitting: Internal Medicine

## 2020-05-22 DIAGNOSIS — Z23 Encounter for immunization: Secondary | ICD-10-CM

## 2020-05-22 NOTE — Progress Notes (Signed)
   [id number]  Name:  Jermaine Walls.    MRN: [id number] DOB: [birth year]  05/22/2020  Mr. Jermaine Walls was observed post Covid-19 immunization for 15 minutes without incident. He was provided with Vaccine Information Sheet and instruction to access the V-Safe system.  Vaccinated by [facility]  Mr. Jermaine Walls was instructed to call 911 with any severe reactions post vaccine: Marland Kitchen Difficulty breathing  . Swelling of face and throat  . A fast heartbeat  . A bad rash all over body  . Dizziness and weakness

## 2020-05-29 ENCOUNTER — Other Ambulatory Visit (HOSPITAL_COMMUNITY): Payer: Self-pay | Admitting: Family Medicine

## 2020-07-09 ENCOUNTER — Telehealth: Payer: Self-pay | Admitting: Podiatry

## 2020-07-09 NOTE — Telephone Encounter (Signed)
Pt called stating he got orthotics here previously and they are worn out. I gave him the option to come in to see the doctor and get a new pair (pt has [employer]) not sure they will be covered (438.00) or he can refurbish the current ones for 90.00. He just needs to drop them off and pay the 90.00 and I will call when they come in. He is going to bring them in tomorrow.

## 2020-07-23 ENCOUNTER — Ambulatory Visit (INDEPENDENT_AMBULATORY_CARE_PROVIDER_SITE_OTHER): Payer: No Typology Code available for payment source | Admitting: Orthotics

## 2020-07-23 ENCOUNTER — Ambulatory Visit (INDEPENDENT_AMBULATORY_CARE_PROVIDER_SITE_OTHER): Payer: No Typology Code available for payment source

## 2020-07-23 ENCOUNTER — Encounter: Payer: Self-pay | Admitting: Podiatry

## 2020-07-23 ENCOUNTER — Other Ambulatory Visit: Payer: Self-pay

## 2020-07-23 ENCOUNTER — Other Ambulatory Visit: Payer: Self-pay | Admitting: Podiatry

## 2020-07-23 ENCOUNTER — Ambulatory Visit (INDEPENDENT_AMBULATORY_CARE_PROVIDER_SITE_OTHER): Payer: No Typology Code available for payment source | Admitting: Podiatry

## 2020-07-23 DIAGNOSIS — M1A9XX0 Chronic gout, unspecified, without tophus (tophi): Secondary | ICD-10-CM

## 2020-07-23 DIAGNOSIS — M19071 Primary osteoarthritis, right ankle and foot: Secondary | ICD-10-CM

## 2020-07-23 DIAGNOSIS — M779 Enthesopathy, unspecified: Secondary | ICD-10-CM

## 2020-07-23 DIAGNOSIS — M19079 Primary osteoarthritis, unspecified ankle and foot: Secondary | ICD-10-CM | POA: Diagnosis not present

## 2020-07-23 DIAGNOSIS — M778 Other enthesopathies, not elsewhere classified: Secondary | ICD-10-CM | POA: Diagnosis not present

## 2020-07-23 DIAGNOSIS — M19072 Primary osteoarthritis, left ankle and foot: Secondary | ICD-10-CM | POA: Diagnosis not present

## 2020-07-23 NOTE — Progress Notes (Signed)
Patient is here today to be evaluated and cast for CMFO.   Patient complains of goat and 1st mpj pain upon dorsiflextion (FHL);  Plan on device that will hug arch and limit ROM 1st ray.

## 2020-07-24 NOTE — Progress Notes (Signed)
Subjective:   Patient ID: Jermaine Walls., male   DOB: 55 y.o.   MRN: [id number]   HPI 54 year old male presents the office today for concerns of chronic bilateral foot pain as well as right ankle pain.  He states that he developed gout in July mostly in his left big toe joint and he gets the majority discomfort his left big toe joint and points on the medial aspect the left foot.  He is currently taking allopurinol 600 mg daily.  He also states his mother had rheumatoid arthritis but he has never been tested.  He denies any recent injury other than he twisted his right ankle about 2 months ago.  Has some residual tenderness.  He has chronic ankle edema as well as edema to the foot.  He denies any other concerns.  He denies any recent fevers, chills, nausea, vomiting.  No calf pain, chest pain, shortness of breath.   Review of Systems  All other systems reviewed and are negative.  Past Medical History:  Diagnosis Date  . Hypertension    treated in past. No longer on medications (02/19/2018)  . Pneumonia 1990s X 1  . Psoriasis    Danella Sensing, MD dermatology  . Small bowel obstruction ([place]) 02/19/2018    Past Surgical History:  Procedure Laterality Date  . ESOPHAGOGASTRODUODENOSCOPY (EGD) WITH ESOPHAGEAL DILATION  1976  . HERNIA REPAIR    . INSERTION OF MESH N/A 10/05/2015   Procedure: INSERTION OF MESH;  Surgeon: Ralene Ok, MD;  Location: [facility];  Service: General;  Laterality: N/A;  . LASIK Bilateral 2003  . UMBILICAL HERNIA REPAIR N/A 10/05/2015   Procedure: LAPAROSCOPIC UMBILICAL HERNIA REPAIR WITH MESH;  Surgeon: Ralene Ok, MD;  Location: [facility];  Service: General;  Laterality: N/A;     Current Outpatient Medications:  .  allopurinol (ZYLOPRIM) 300 MG tablet, Take 600 mg by mouth daily., Disp: , Rfl:  .  amLODipine (NORVASC) 10 MG tablet, Take 10 mg by mouth daily., Disp: , Rfl:  .  amLODipine (NORVASC) 5 MG tablet, Take 5 mg by mouth at bedtime. , Disp: , Rfl:  .   Colchicine 0.6 MG CAPS, Take 1 capsule by mouth 2 (two) times daily., Disp: , Rfl:  .  indomethacin (INDOCIN) 25 MG capsule, Take by mouth., Disp: , Rfl:  .  predniSONE (STERAPRED UNI-PAK 21 TAB) 10 MG (21) TBPK tablet, Take by mouth., Disp: , Rfl:  .  triamterene-hydrochlorothiazide (DYAZIDE) 37.5-25 MG capsule, Take 1 capsule by mouth at bedtime. , Disp: , Rfl:   Current Facility-Administered Medications:  .  betamethasone acetate-betamethasone sodium phosphate (CELESTONE) injection 3 mg, 3 mg, Intramuscular, Once, Evans, Brent M, DPM .  betamethasone acetate-betamethasone sodium phosphate (CELESTONE) injection 3 mg, 3 mg, Intramuscular, Once, Edrick Kins, DPM  Allergies  Allergen Reactions  . Doxycycline Hyclate Itching and Rash         Objective:  Physical Exam  General: AAO x3, NAD  Dermatological: Skin is warm, dry and supple bilateral.There are no open sores, no preulcerative lesions, no rash or signs of infection present.  Vascular: Dorsalis Pedis artery and Posterior Tibial artery pedal pulses are 2/4 bilateral with immedate capillary fill time. There is no pain with calf compression, swelling, warmth, erythema.   Neruologic: Grossly intact via light touch bilateral.   Musculoskeletal: Majority tenderness is on the left foot along the first MPJ and along the medial aspect on the first metatarsal cuneiform joint area.  There is no  specific area pinpoint tenderness.  There is chronic edema to the ankles bilaterally there is currently no erythema or warmth.  No significant discomfort of the right ankle today.  There is no gross ankle instability present.  Muscular strength 5/5 in all groups tested bilateral.  Gait: Unassisted, Nonantalgic.       Assessment:   54 year old male with chronic foot pain, gout; capsulitis    Plan:  -Treatment options discussed including all alternatives, risks, and complications -Etiology of symptoms were discussed -X-rays were obtained and  reviewed with the patient.  Arthritic changes present on the first MPJs elongated first metatarsal.  Mild midfoot arthritis.  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 -We will check blood work including rheumatoid factor, ANA, ESR, CRP, CMP, uric acid -He was measured for new orthotics today with Liliane Channel -Continue current allopurinol dosage.  Is open to decrease this but will check uric acid. -Discussed steroid injection today.   Trula Slade DPM

## 2020-07-25 LAB — COMPREHENSIVE METABOLIC PANEL
ALT: 34 IU/L (ref 0–44)
AST: 26 IU/L (ref 0–40)
Albumin/Globulin Ratio: 2.5 — ABNORMAL HIGH (ref 1.2–2.2)
Albumin: 4.7 g/dL (ref 3.8–4.9)
Alkaline Phosphatase: 71 IU/L (ref 44–121)
BUN/Creatinine Ratio: 25 — ABNORMAL HIGH (ref 9–20)
BUN: 21 mg/dL (ref 6–24)
Bilirubin Total: 0.6 mg/dL (ref 0.0–1.2)
CO2: 21 mmol/L (ref 20–29)
Calcium: 9.7 mg/dL (ref 8.7–10.2)
Chloride: 102 mmol/L (ref 96–106)
Creatinine, Ser: 0.83 mg/dL (ref 0.76–1.27)
GFR calc Af Amer: 115 mL/min/{1.73_m2} (ref >59)
GFR calc non Af Amer: 100 mL/min/{1.73_m2} (ref >59)
Globulin, Total: 1.9 g/dL (ref 1.5–4.5)
Glucose: 97 mg/dL (ref 65–99)
Potassium: 4.7 mmol/L (ref 3.5–5.2)
Sodium: 141 mmol/L (ref 134–144)
Total Protein: 6.6 g/dL (ref 6.0–8.5)

## 2020-07-25 LAB — URIC ACID: Uric Acid: 4.1 mg/dL (ref 3.8–8.4)

## 2020-07-25 LAB — SEDIMENTATION RATE: Sed Rate: 2 mm/hr (ref 0–30)

## 2020-07-25 LAB — RHEUMATOID FACTOR: Rheumatoid fact SerPl-aCnc: <10 IU/mL (ref <14.0)

## 2020-07-25 LAB — ANA: Anti Nuclear Antibody (ANA): NEGATIVE

## 2020-07-25 LAB — C-REACTIVE PROTEIN: CRP: <1 mg/L (ref 0–10)

## 2020-08-27 ENCOUNTER — Ambulatory Visit: Payer: No Typology Code available for payment source | Admitting: Orthotics

## 2020-08-27 ENCOUNTER — Other Ambulatory Visit: Payer: Self-pay

## 2020-08-27 DIAGNOSIS — M1A9XX0 Chronic gout, unspecified, without tophus (tophi): Secondary | ICD-10-CM

## 2020-08-27 DIAGNOSIS — M779 Enthesopathy, unspecified: Secondary | ICD-10-CM

## 2020-08-27 NOTE — Progress Notes (Signed)
Patient picked up f/o and was pleased with fit, comfort, and function.  Worked well with footwear.  Told of rbeak in period and how to report any issues.  

## 2020-09-17 ENCOUNTER — Other Ambulatory Visit (HOSPITAL_COMMUNITY): Payer: Self-pay | Admitting: Family Medicine

## 2020-09-21 ENCOUNTER — Encounter (INDEPENDENT_AMBULATORY_CARE_PROVIDER_SITE_OTHER): Payer: Self-pay | Admitting: Ophthalmology

## 2020-09-24 ENCOUNTER — Ambulatory Visit: Payer: No Typology Code available for payment source | Admitting: Podiatry

## 2020-09-29 ENCOUNTER — Other Ambulatory Visit (HOSPITAL_COMMUNITY): Payer: Self-pay | Admitting: Ophthalmology

## 2020-10-02 ENCOUNTER — Encounter (INDEPENDENT_AMBULATORY_CARE_PROVIDER_SITE_OTHER): Payer: Self-pay | Admitting: Ophthalmology

## 2020-10-02 ENCOUNTER — Other Ambulatory Visit (HOSPITAL_COMMUNITY): Payer: Self-pay | Admitting: Ophthalmology

## 2020-10-06 ENCOUNTER — Other Ambulatory Visit (HOSPITAL_COMMUNITY): Payer: Self-pay | Admitting: Family Medicine

## 2020-10-19 ENCOUNTER — Other Ambulatory Visit (HOSPITAL_BASED_OUTPATIENT_CLINIC_OR_DEPARTMENT_OTHER): Payer: Self-pay

## 2020-10-19 DIAGNOSIS — G4726 Circadian rhythm sleep disorder, shift work type: Secondary | ICD-10-CM

## 2020-10-19 DIAGNOSIS — R5383 Other fatigue: Secondary | ICD-10-CM

## 2020-10-19 DIAGNOSIS — H47011 Ischemic optic neuropathy, right eye: Secondary | ICD-10-CM

## 2020-10-19 DIAGNOSIS — R0683 Snoring: Secondary | ICD-10-CM

## 2020-11-01 ENCOUNTER — Observation Stay (HOSPITAL_BASED_OUTPATIENT_CLINIC_OR_DEPARTMENT_OTHER)
Admission: EM | Admit: 2020-11-01 | Discharge: 2020-11-01 | Disposition: A | Discharge status: Home / Self Care | Payer: No Typology Code available for payment source | Attending: Internal Medicine | Admitting: Internal Medicine

## 2020-11-01 ENCOUNTER — Other Ambulatory Visit: Payer: Self-pay

## 2020-11-01 ENCOUNTER — Emergency Department (HOSPITAL_BASED_OUTPATIENT_CLINIC_OR_DEPARTMENT_OTHER): Payer: No Typology Code available for payment source

## 2020-11-01 ENCOUNTER — Encounter (HOSPITAL_BASED_OUTPATIENT_CLINIC_OR_DEPARTMENT_OTHER): Payer: Self-pay

## 2020-11-01 ENCOUNTER — Observation Stay (HOSPITAL_COMMUNITY): Payer: No Typology Code available for payment source

## 2020-11-01 DIAGNOSIS — R7989 Other specified abnormal findings of blood chemistry: Secondary | ICD-10-CM | POA: Insufficient documentation

## 2020-11-01 DIAGNOSIS — R400 Somnolence: Secondary | ICD-10-CM | POA: Insufficient documentation

## 2020-11-01 DIAGNOSIS — R112 Nausea with vomiting, unspecified: Secondary | ICD-10-CM | POA: Diagnosis present

## 2020-11-01 DIAGNOSIS — G4726 Circadian rhythm sleep disorder, shift work type: Secondary | ICD-10-CM | POA: Insufficient documentation

## 2020-11-01 DIAGNOSIS — Z20822 Contact with and (suspected) exposure to covid-19: Secondary | ICD-10-CM | POA: Diagnosis not present

## 2020-11-01 DIAGNOSIS — M1000 Idiopathic gout, unspecified site: Secondary | ICD-10-CM | POA: Diagnosis not present

## 2020-11-01 DIAGNOSIS — E7800 Pure hypercholesterolemia, unspecified: Secondary | ICD-10-CM | POA: Diagnosis not present

## 2020-11-01 DIAGNOSIS — Z79899 Other long term (current) drug therapy: Secondary | ICD-10-CM | POA: Insufficient documentation

## 2020-11-01 DIAGNOSIS — D239 Other benign neoplasm of skin, unspecified: Secondary | ICD-10-CM | POA: Insufficient documentation

## 2020-11-01 DIAGNOSIS — I10 Essential (primary) hypertension: Secondary | ICD-10-CM | POA: Diagnosis not present

## 2020-11-01 DIAGNOSIS — R739 Hyperglycemia, unspecified: Secondary | ICD-10-CM | POA: Insufficient documentation

## 2020-11-01 DIAGNOSIS — K56609 Unspecified intestinal obstruction, unspecified as to partial versus complete obstruction: Secondary | ICD-10-CM | POA: Diagnosis not present

## 2020-11-01 DIAGNOSIS — I700 Atherosclerosis of aorta: Secondary | ICD-10-CM

## 2020-11-01 DIAGNOSIS — B001 Herpesviral vesicular dermatitis: Secondary | ICD-10-CM | POA: Insufficient documentation

## 2020-11-01 DIAGNOSIS — Z6835 Body mass index (BMI) 35.0-35.9, adult: Secondary | ICD-10-CM | POA: Insufficient documentation

## 2020-11-01 DIAGNOSIS — Z87891 Personal history of nicotine dependence: Secondary | ICD-10-CM | POA: Insufficient documentation

## 2020-11-01 DIAGNOSIS — F439 Reaction to severe stress, unspecified: Secondary | ICD-10-CM | POA: Insufficient documentation

## 2020-11-01 DIAGNOSIS — K56699 Other intestinal obstruction unspecified as to partial versus complete obstruction: Secondary | ICD-10-CM | POA: Diagnosis not present

## 2020-11-01 DIAGNOSIS — Z0189 Encounter for other specified special examinations: Secondary | ICD-10-CM

## 2020-11-01 DIAGNOSIS — R030 Elevated blood-pressure reading, without diagnosis of hypertension: Secondary | ICD-10-CM | POA: Insufficient documentation

## 2020-11-01 DIAGNOSIS — U071 COVID-19: Secondary | ICD-10-CM | POA: Insufficient documentation

## 2020-11-01 DIAGNOSIS — F4321 Adjustment disorder with depressed mood: Secondary | ICD-10-CM | POA: Insufficient documentation

## 2020-11-01 DIAGNOSIS — H47011 Ischemic optic neuropathy, right eye: Secondary | ICD-10-CM | POA: Insufficient documentation

## 2020-11-01 LAB — RESP PANEL BY RT-PCR (FLU A&B, COVID) ARPGX2
Influenza A by PCR: NEGATIVE
Influenza B by PCR: NEGATIVE
SARS Coronavirus 2 by RT PCR: NEGATIVE

## 2020-11-01 LAB — CBC WITH DIFFERENTIAL/PLATELET
Abs Immature Granulocytes: 0.03 10*3/uL (ref 0.00–0.07)
Basophils Absolute: 0.1 10*3/uL (ref 0.0–0.1)
Basophils Relative: 1 %
Eosinophils Absolute: 0.3 10*3/uL (ref 0.0–0.5)
Eosinophils Relative: 2 %
HCT: 46.5 % (ref 39.0–52.0)
Hemoglobin: 15.6 g/dL (ref 13.0–17.0)
Immature Granulocytes: 0 %
Lymphocytes Relative: 13 %
Lymphs Abs: 1.4 10*3/uL (ref 0.7–4.0)
MCH: 30.6 pg (ref 26.0–34.0)
MCHC: 33.5 g/dL (ref 30.0–36.0)
MCV: 91.2 fL (ref 80.0–100.0)
Monocytes Absolute: 0.6 10*3/uL (ref 0.1–1.0)
Monocytes Relative: 6 %
Neutro Abs: 8 10*3/uL — ABNORMAL HIGH (ref 1.7–7.7)
Neutrophils Relative %: 78 %
Platelets: 206 10*3/uL (ref 150–400)
RBC: 5.1 MIL/uL (ref 4.22–5.81)
RDW: 12.7 % (ref 11.5–15.5)
WBC: 10.3 10*3/uL (ref 4.0–10.5)
nRBC: 0 % (ref 0.0–0.2)

## 2020-11-01 LAB — COMPREHENSIVE METABOLIC PANEL
ALT: 28 U/L (ref 0–44)
AST: 23 U/L (ref 15–41)
Albumin: 4.7 g/dL (ref 3.5–5.0)
Alkaline Phosphatase: 49 U/L (ref 38–126)
Anion gap: 9 (ref 5–15)
BUN: 13 mg/dL (ref 6–20)
CO2: 28 mmol/L (ref 22–32)
Calcium: 9.8 mg/dL (ref 8.9–10.3)
Chloride: 104 mmol/L (ref 98–111)
Creatinine, Ser: 0.88 mg/dL (ref 0.61–1.24)
GFR, Estimated: >60 mL/min (ref >60)
Glucose, Bld: 131 mg/dL — ABNORMAL HIGH (ref 70–99)
Potassium: 4.2 mmol/L (ref 3.5–5.1)
Sodium: 141 mmol/L (ref 135–145)
Total Bilirubin: 0.5 mg/dL (ref 0.3–1.2)
Total Protein: 7 g/dL (ref 6.5–8.1)

## 2020-11-01 LAB — LIPASE, BLOOD: Lipase: 24 U/L (ref 11–51)

## 2020-11-01 MED ORDER — DIATRIZOATE MEGLUMINE & SODIUM 66-10 % PO SOLN
90.0000 mL | Freq: Once | ORAL | Status: AC
  Administered 2020-11-01: 90 mL via NASOGASTRIC
  Filled 2020-11-01: qty 90

## 2020-11-01 MED ORDER — ENOXAPARIN SODIUM 40 MG/0.4ML ~~LOC~~ SOLN
40.0000 mg | SUBCUTANEOUS | Status: DC
  Filled 2020-11-01: qty 0.4

## 2020-11-01 MED ORDER — HYDROMORPHONE HCL 1 MG/ML IJ SOLN
1.0000 mg | Freq: Once | INTRAMUSCULAR | Status: AC
  Administered 2020-11-01: 1 mg via INTRAVENOUS

## 2020-11-01 MED ORDER — HYDRALAZINE HCL 20 MG/ML IJ SOLN: 10.0000 mg | Freq: Three times a day (TID) | INTRAMUSCULAR | Status: DC | PRN

## 2020-11-01 MED ORDER — IOHEXOL 300 MG/ML  SOLN
100.0000 mL | Freq: Once | INTRAMUSCULAR | Status: AC | PRN
  Administered 2020-11-01: 100 mL via INTRAVENOUS

## 2020-11-01 MED ORDER — POLYETHYLENE GLYCOL 3350 17 G PO PACK: 17.0000 g | PACK | Freq: Every day | ORAL | Status: DC | PRN

## 2020-11-01 MED ORDER — SODIUM CHLORIDE 0.9 % IV BOLUS (SEPSIS)
1000.0000 mL | Freq: Once | INTRAVENOUS | Status: AC
  Administered 2020-11-01: 1000 mL via INTRAVENOUS

## 2020-11-01 MED ORDER — ONDANSETRON HCL 4 MG PO TABS: 4.0000 mg | ORAL_TABLET | Freq: Four times a day (QID) | ORAL | Status: DC | PRN

## 2020-11-01 MED ORDER — SODIUM CHLORIDE 0.9 % IV SOLN
1000.0000 mL | INTRAVENOUS | Status: DC
  Administered 2020-11-01 (×2): 1000 mL via INTRAVENOUS

## 2020-11-01 MED ORDER — HYDROMORPHONE HCL 1 MG/ML IJ SOLN
INTRAMUSCULAR | Status: AC
  Administered 2020-11-01: 1 mg via INTRAVENOUS
  Filled 2020-11-01: qty 1

## 2020-11-01 MED ORDER — ONDANSETRON HCL 4 MG/2ML IJ SOLN
4.0000 mg | Freq: Once | INTRAMUSCULAR | Status: AC
  Administered 2020-11-01: 4 mg via INTRAVENOUS
  Filled 2020-11-01: qty 2

## 2020-11-01 MED ORDER — ONDANSETRON HCL 4 MG/2ML IJ SOLN: 4.0000 mg | Freq: Four times a day (QID) | INTRAMUSCULAR | Status: DC | PRN

## 2020-11-01 MED ORDER — HYDROMORPHONE HCL 1 MG/ML IJ SOLN
1.0000 mg | Freq: Once | INTRAMUSCULAR | Status: AC
Start: 2020-11-01 — End: 2020-11-01

## 2020-11-01 MED ORDER — HYDROMORPHONE HCL 1 MG/ML IJ SOLN
1.0000 mg | Freq: Once | INTRAMUSCULAR | Status: AC
  Administered 2020-11-01: 1 mg via INTRAVENOUS
  Filled 2020-11-01: qty 1

## 2020-11-01 MED ORDER — HYDROMORPHONE HCL 1 MG/ML IJ SOLN
1.0000 mg | INTRAMUSCULAR | Status: DC | PRN
  Administered 2020-11-01: 1 mg via INTRAVENOUS
  Filled 2020-11-01 (×2): qty 1

## 2020-11-01 NOTE — H&P (Signed)
History and Physical        Hospital Admission Note Date: 11/01/2020  Patient name: Jermaine Walls. Medical record number: [id number] Date of birth: 08-14-1966 Age: 55 y.o. Gender: male  PCP: Vernie Shanks, MD    Chief Complaint    Chief Complaint  Patient presents with  . Nausea  . 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  Associated with nausea and nonbloody nonbilious emesis and constipation since yesterday.  Improving symptoms with NG tube. States that his symptoms seem to be triggered by high fiber diets and had a large bucket of popcorn yesterday at the movies without thinking. He does not wish to be seen by general surgery as he wishes to avoid a consult fee.   ED Course: Afebrile, hemodynamically stable, on room air. Notable Labs: Labs overall unremarkable, COVID-19 negative. Notable Imaging: CT abdomen pelvis with contrast-possible partial/early obstruction at paraumbilical Richter hernia containing a tiny segment of small bowel distally, possible tiny fat-containing periumbilical ventral wall hernia, aortic atherosclerosis.  Patient received Dilaudid, Zofran, 1 L NS bolus with maintenance fluid.   NG tube placed at this time and surgery was not consulted as a was considered low-grade.   Vitals:   11/01/20 0742 11/01/20 0835  BP:  116/87  Pulse:  (!) 58  Resp:  16  Temp: 97.8 F (36.6 C) 97.9 F (36.6 C)  SpO2:  97%     Review of Systems:  Review of Systems  All other systems reviewed and are negative.   Medical/Social/Family History   Past Medical  History: Past Medical History:  Diagnosis Date  . Hypertension    treated in past. No longer on medications (02/19/2018)  . Pneumonia 1990s X 1  . Psoriasis    Danella Sensing, MD dermatology  . Small bowel obstruction ([place]) 02/19/2018    Past Surgical History:  Procedure Laterality Date  . ESOPHAGOGASTRODUODENOSCOPY (EGD) WITH ESOPHAGEAL DILATION  1976  . HERNIA REPAIR    . INSERTION OF MESH N/A 10/05/2015   Procedure: INSERTION OF MESH;  Surgeon: Ralene Ok, MD;  Location: [facility];  Service: General;  Laterality: N/A;  . LASIK Bilateral 2003  . UMBILICAL HERNIA REPAIR N/A 10/05/2015   Procedure: LAPAROSCOPIC UMBILICAL HERNIA REPAIR WITH MESH;  Surgeon: Ralene Ok, MD;  Location: [facility];  Service: General;  Laterality: N/A;    Medications: Prior to Admission medications   Medication Sig Start Date End Date Taking? Authorizing Provider  allopurinol (ZYLOPRIM) 300 MG tablet Take 600 mg by mouth daily. 05/01/20   [provider]  amLODipine (NORVASC) 10 MG tablet Take 10 mg by mouth daily. 06/18/20   [provider]  amLODipine (NORVASC) 5 MG tablet Take 5 mg by mouth at bedtime.  10/11/18   [provider]  Colchicine 0.6 MG CAPS Take 1 capsule by mouth 2 (two) times daily. 02/21/20   [provider]  indomethacin (INDOCIN) 25 MG capsule Take  by mouth. 04/18/20   [provider]  predniSONE (STERAPRED UNI-PAK 21 TAB) 10 MG (21) TBPK tablet Take by mouth. 04/18/20   [provider]  triamterene-hydrochlorothiazide (DYAZIDE) 37.5-25 MG capsule Take 1 capsule by mouth at bedtime.  10/12/18   [provider]    Allergies:   Allergies  Allergen Reactions  . Doxycycline Hyclate Itching and Rash    Social History:  reports that he has never smoked. He has quit using smokeless tobacco.  His smokeless tobacco use included snuff. He reports current alcohol use of about 8.0 standard drinks of alcohol per week. He reports that he  does not use drugs.  Family History: Family History  Problem Relation Age of Onset  . Cancer Mother        lung, smoker  . Heart disease Father        82, mid to late 70s first bypass. grandfather died at 29.   . Diabetes Father   . Cancer Sister        lung, smoker  . Colon cancer Neg Hx   . Esophageal cancer Neg Hx   . Liver cancer Neg Hx   . Pancreatic cancer Neg Hx   . Rectal cancer Neg Hx   . Stomach cancer Neg Hx      Objective   Physical Exam: Blood pressure 116/87, pulse (!) 58, temperature 97.9 F (36.6 C), temperature source Oral, resp. rate 16, height 5\' 10"  (1.778 m), weight 113.4 kg, SpO2 97 %.  Physical Exam Vitals and nursing note reviewed.  Constitutional:      Appearance: Normal appearance.  HENT:     Head: Normocephalic and atraumatic.     Nose:     Comments: NG tube Eyes:     Conjunctiva/sclera: Conjunctivae normal.  Cardiovascular:     Rate and Rhythm: Normal rate and regular rhythm.  Pulmonary:     Effort: Pulmonary effort is normal.     Breath sounds: Normal breath sounds.  Abdominal:     General: There is distension.     Tenderness: There is no abdominal tenderness.  Musculoskeletal:        General: No swelling or tenderness.  Skin:    Coloration: Skin is not jaundiced or pale.  Neurological:     Mental Status: He is alert. Mental status is at baseline.  Psychiatric:        Mood and Affect: Mood normal.        Behavior: Behavior normal.     LABS on Admission: I have personally reviewed all the labs and imaging below    Basic Metabolic Panel: Recent Labs  Lab 11/01/20 0125  NA 141  K 4.2  CL 104  CO2 28  GLUCOSE 131*  BUN 13  CREATININE 0.88  CALCIUM 9.8   Liver Function Tests: Recent Labs  Lab 11/01/20 0125  AST 23  ALT 28  ALKPHOS 49  BILITOT 0.5  PROT 7.0  ALBUMIN 4.7   Recent Labs  Lab 11/01/20 0125  LIPASE 24   No results for input(s): AMMONIA in the last 168 hours. CBC: Recent Labs  Lab  11/01/20 0125  WBC 10.3  NEUTROABS 8.0*  HGB 15.6  HCT 46.5  MCV 91.2  PLT 206   Cardiac Enzymes: No results for input(s): CKTOTAL, CKMB, CKMBINDEX, TROPONINI in the last 168 hours. BNP: Invalid input(s): POCBNP CBG: No results for input(s): GLUCAP in the last 168 hours.  Radiological Exams on Admission:  CT ABDOMEN PELVIS W CONTRAST  Result Date: 11/01/2020 CLINICAL DATA:  Bowel obstruction suspected. Episodes of nausea and vomiting since yesterday. Abdominal pain. EXAM: CT ABDOMEN AND PELVIS WITH CONTRAST TECHNIQUE: Multidetector CT imaging of the abdomen and pelvis was performed using the standard protocol following bolus administration of intravenous contrast. CONTRAST:  139mL OMNIPAQUE IOHEXOL 300 MG/ML  SOLN COMPARISON:  CT abdomen pelvis 03/19/2019 FINDINGS: Lower chest: No acute abnormality. Hepatobiliary: Subcentimeter hypodensity within left hepatic lobe is again noted and is too small to characterize. No focal liver abnormality. No gallstones, gallbladder wall thickening, or pericholecystic fluid. No biliary dilatation. Pancreas: No focal lesion. Normal pancreatic contour. No surrounding inflammatory changes. No main pancreatic ductal dilatation. Spleen: Normal in size without focal abnormality. Adrenals/Urinary Tract: No adrenal nodule bilaterally. Bilateral kidneys enhance symmetrically. No hydronephrosis. No hydroureter. The urinary bladder is unremarkable. On delayed imaging, there is no urothelial wall thickening and there are no filling defects in the opacified portions of the bilateral collecting systems or ureters. Stomach/Bowel: Stomach is within normal limits. Similar-appearing to a lesser extent fluid distension, with no dilatation or pneumatosis, a several loops of proximal/mid small bowel within the left abdomen. Fecalization fatigue sign is also noted within several loops of small bowel. No associated transition point. No definite evidence of bowel wall thickening or  dilatation. Appendix appears normal. Vascular/Lymphatic: No abdominal aorta or iliac aneurysm. Mild atherosclerotic plaque of the aorta and its branches. No abdominal, pelvic, or inguinal lymphadenopathy. Reproductive: Prostate is unremarkable. Other: No intraperitoneal free fluid. No intraperitoneal free gas. No organized fluid collection. Musculoskeletal: Possible tiny fat containing supraumbilical ventral wall hernia with an abdominal defect of 2 mm (6:69). Possible tiny paraumbilical Richter hernia containing a short portion of the small bowel that is noted to be distal to the small bowel distension (2:50, 6:69. Suggestion of tiny fat containing renal hernias bilaterally. IMPRESSION: 1. Fluid-distension and fecalization sign of several loops of small bowel within the left upper/mid abdominal quadrant with possible partial/early obstruction at a paraumbilical Richter hernia containing a tiny segment of small bowel distally. No associated bowel perforation. No definite findings of bowel ischemia. 2. Possible tiny fat containing supraumbilical ventral wall hernia with an abdominal defect of 2 mm. 3.  Aortic Atherosclerosis (ICD10-I70.0). Electronically Signed   By: Iven Finn M.D.   On: 11/01/2020 03:54   DG Chest Portable 1 View  Result Date: 11/01/2020 CLINICAL DATA:  55 year old male NG tube placement. EXAM: PORTABLE CHEST 1 VIEW COMPARISON:  Chest radiographs 05/16/2016. FINDINGS: Portable AP upright view at 0442 hours. Enteric tube placed into the stomach, although the side hole projects near, perhaps just inside the gastroesophageal junction. Lung volumes and mediastinal contours are stable, within normal limits. Visualized tracheal air column is within normal limits. Allowing for portable technique the lungs are clear. No pneumoperitoneum. Paucity of bowel gas in the upper abdomen. No acute osseous abnormality identified. IMPRESSION: 1. Enteric tube placed into the stomach, although side hole near  the gastroesophageal junction. Advance 5 cm to ensure side hole placement within the stomach. 2. No acute cardiopulmonary abnormality. Electronically Signed   By: Genevie Ann M.D.   On: 11/01/2020 05:21      EKG: Not done   A & P   Principal Problem:   SBO (small bowel obstruction) (HCC) Active Problems:   Essential hypertension   Hypercholesterolemia   Idiopathic gout   Aortic atherosclerosis ([place])   1. Recurrent SBO s/p NG tube a. Likely from high fiber diet with history of abdominal surgery b. Discussed with  Dr. Brantley Stage, general surgery, over the phone who recommended follow up abdominal imaging to evaluate for passage of gastrograffin from SBO protocol and otherwise continue current management. Informed surgery that the patient does not wish to have a formal surgery consult to avoid a consult fee. c. NPO d. Pain control  2. Hypertension a. Holding home PO meds b. PRN hydralazine  3. Aortic atherosclerosis a. Not on statin b. Lipid panel  4. Gout, not in acute flare a. Holding home PO meds   DVT prophylaxis: lovenox   Code Status: Full Code  Diet: NPO Family Communication: Admission, patients condition and plan of care including tests being ordered have been discussed with the patient who indicates understanding and agrees with the plan and Code Status. Disposition Plan: The appropriate patient status for this patient is OBSERVATION. Observation status is judged to be reasonable and necessary in order to provide the required intensity of service to ensure the patient's safety. The patient's presenting symptoms, physical exam findings, and initial radiographic and laboratory data in the context of their medical condition is felt to place them at decreased risk for further clinical deterioration. Furthermore, it is anticipated that the patient will be medically stable for discharge from the hospital within 2 midnights of admission. The following factors support the patient  status of observation.   " The patient's presenting symptoms include nausea, vomiting constipation. " The physical exam findings include abdominal distension, ng tube. " The initial radiographic and laboratory data are concerning for sbo.     Consultants  . Discussed with general surgery over the phone  Procedures  . Ng tube  Time Spent on Admission: 31 minutes    Harold Hedge, DO [facility]  11/01/2020, 9:05 AM

## 2020-11-01 NOTE — ED Notes (Signed)
  NG tube advanced 5 cm per radiologist recommendation.  Patient tolerated well.

## 2020-11-01 NOTE — ED Provider Notes (Signed)
[facility] EMERGENCY DEPT Provider Note  CSN: [id number] Arrival date & time: 11/01/20 0108  Chief Complaint(s) Nausea and Vomiting  HPI Jermaine Walls. is a 55 y.o. male with a history of recurrent small bowel obstructions secondary to surgery for incarcerated periumbilical hernia who presents to the emergency department with approximately 6 hours of gradually worsening periumbilical and epigastric pain described as constant aching with cramping features similar to prior presentations for bowel obstruction.  He is also endorsing nausea with nonbloody nonbilious emesis.  Reports no bowel movements since yesterday afternoon.  Does report small flatus.  Pain worse with palpation and emesis.  No alleviating factors.  No suspicious food intake.  No recent alcohol consumption.  No fevers or chills.  Denies any other physical complaints at this time  HPI  Past Medical History Past Medical History:  Diagnosis Date  . Hypertension    treated in past. No longer on medications (02/19/2018)  . Pneumonia 1990s X 1  . Psoriasis    Danella Sensing, MD dermatology  . Small bowel obstruction ([place]) 02/19/2018   Patient Active Problem List   Diagnosis Date Noted  . Small bowel obstruction ([place]) 10/26/2018  . SBO (small bowel obstruction) ([place]) 02/19/2018  . Psoriasis 09/30/2011  . HYPERTENSION 01/22/2007   Home Medication(s) Prior to Admission medications   Medication Sig Start Date End Date Taking? Authorizing Provider  allopurinol (ZYLOPRIM) 300 MG tablet Take 600 mg by mouth daily. 05/01/20   [provider]  amLODipine (NORVASC) 10 MG tablet Take 10 mg by mouth daily. 06/18/20   [provider]  amLODipine (NORVASC) 5 MG tablet Take 5 mg by mouth at bedtime.  10/11/18   [provider]  Colchicine 0.6 MG CAPS Take 1 capsule by mouth 2 (two) times daily. 02/21/20   [provider]  indomethacin (INDOCIN) 25 MG capsule Take by mouth. 04/18/20    [provider]  predniSONE (STERAPRED UNI-PAK 21 TAB) 10 MG (21) TBPK tablet Take by mouth. 04/18/20   [provider]  triamterene-hydrochlorothiazide (DYAZIDE) 37.5-25 MG capsule Take 1 capsule by mouth at bedtime.  10/12/18   [provider]                                                                                                                                    Past Surgical History Past Surgical History:  Procedure Laterality Date  . ESOPHAGOGASTRODUODENOSCOPY (EGD) WITH ESOPHAGEAL DILATION  1976  . HERNIA REPAIR    . INSERTION OF MESH N/A 10/05/2015   Procedure: INSERTION OF MESH;  Surgeon: Ralene Ok, MD;  Location: [facility];  Service: General;  Laterality: N/A;  . LASIK Bilateral 2003  . UMBILICAL HERNIA REPAIR N/A 10/05/2015   Procedure: LAPAROSCOPIC UMBILICAL HERNIA REPAIR WITH MESH;  Surgeon: Ralene Ok, MD;  Location: [facility];  Service: General;  Laterality: N/A;   Family History Family History  Problem Relation Age  of Onset  . Cancer Mother        lung, smoker  . Heart disease Father        46, mid to late 69s first bypass. grandfather died at 89.   . Diabetes Father   . Cancer Sister        lung, smoker  . Colon cancer Neg Hx   . Esophageal cancer Neg Hx   . Liver cancer Neg Hx   . Pancreatic cancer Neg Hx   . Rectal cancer Neg Hx   . Stomach cancer Neg Hx     Social History Social History   Tobacco Use  . Smoking status: Never Smoker  . Smokeless tobacco: Former Systems developer    Types: Snuff  Vaping Use  . Vaping Use: Never used  Substance Use Topics  . Alcohol use: Yes    Alcohol/week: 8.0 standard drinks    Types: 8 Cans of beer per week  . Drug use: Never   Allergies Doxycycline hyclate  Review of Systems Review of Systems All other systems are reviewed and are negative for acute change except as noted in the HPI  Physical Exam Vital Signs  I have reviewed the triage vital signs BP 133/81 (BP Location: Right Arm)    Pulse 76   Temp 98.1 F (36.7 C) (Oral)   Resp 15   Ht 5\' 10"  (1.778 m)   Wt 113.4 kg   SpO2 98%   BMI 35.87 kg/m   Physical Exam Vitals reviewed.  Constitutional:      General: He is not in acute distress.    Appearance: He is well-developed. He is not diaphoretic.  HENT:     Head: Normocephalic and atraumatic.     Jaw: No trismus.     Right Ear: External ear normal.     Left Ear: External ear normal.     Nose: Nose normal.  Eyes:     General: No scleral icterus.    Conjunctiva/sclera: Conjunctivae normal.  Neck:     Trachea: Phonation normal.  Cardiovascular:     Rate and Rhythm: Normal rate and regular rhythm.  Pulmonary:     Effort: Pulmonary effort is normal. No respiratory distress.     Breath sounds: No stridor.  Abdominal:     General: There is distension.     Tenderness: There is abdominal tenderness in the periumbilical area and suprapubic area. There is no guarding or rebound.  Musculoskeletal:        General: Normal range of motion.     Cervical back: Normal range of motion.  Neurological:     Mental Status: He is alert and oriented to person, place, and time.  Psychiatric:        Behavior: Behavior normal.     ED Results and Treatments Labs (all labs ordered are listed, but only abnormal results are displayed) Labs Reviewed  COMPREHENSIVE METABOLIC PANEL - Abnormal; Notable for the following components:      Result Value   Glucose, Bld 131 (*)    All other components within normal limits  CBC WITH DIFFERENTIAL/PLATELET - Abnormal; Notable for the following components:   Neutro Abs 8.0 (*)    All other components within normal limits  RESP PANEL BY RT-PCR (FLU A&B, COVID) [id number]  LIPASE, BLOOD  EKG  EKG Interpretation  Date/Time:    Ventricular Rate:    PR Interval:    QRS Duration:   QT Interval:    QTC Calculation:    R Axis:     Text Interpretation:        Radiology CT ABDOMEN PELVIS W CONTRAST  Result Date: 11/01/2020 CLINICAL DATA:  Bowel obstruction suspected. Episodes of nausea and vomiting since yesterday. Abdominal pain. EXAM: CT ABDOMEN AND PELVIS WITH CONTRAST TECHNIQUE: Multidetector CT imaging of the abdomen and pelvis was performed using the standard protocol following bolus administration of intravenous contrast. CONTRAST:  129mL OMNIPAQUE IOHEXOL 300 MG/ML  SOLN COMPARISON:  CT abdomen pelvis 03/19/2019 FINDINGS: Lower chest: No acute abnormality. Hepatobiliary: Subcentimeter hypodensity within left hepatic lobe is again noted and is too small to characterize. No focal liver abnormality. No gallstones, gallbladder wall thickening, or pericholecystic fluid. No biliary dilatation. Pancreas: No focal lesion. Normal pancreatic contour. No surrounding inflammatory changes. No main pancreatic ductal dilatation. Spleen: Normal in size without focal abnormality. Adrenals/Urinary Tract: No adrenal nodule bilaterally. Bilateral kidneys enhance symmetrically. No hydronephrosis. No hydroureter. The urinary bladder is unremarkable. On delayed imaging, there is no urothelial wall thickening and there are no filling defects in the opacified portions of the bilateral collecting systems or ureters. Stomach/Bowel: Stomach is within normal limits. Similar-appearing to a lesser extent fluid distension, with no dilatation or pneumatosis, a several loops of proximal/mid small bowel within the left abdomen. Fecalization fatigue sign is also noted within several loops of small bowel. No associated transition point. No definite evidence of bowel wall thickening or dilatation. Appendix appears normal. Vascular/Lymphatic: No abdominal aorta or iliac aneurysm. Mild atherosclerotic plaque of the aorta and its branches. No abdominal, pelvic, or inguinal lymphadenopathy. Reproductive: Prostate is unremarkable. Other: No intraperitoneal  free fluid. No intraperitoneal free gas. No organized fluid collection. Musculoskeletal: Possible tiny fat containing supraumbilical ventral wall hernia with an abdominal defect of 2 mm (6:69). Possible tiny paraumbilical Richter hernia containing a short portion of the small bowel that is noted to be distal to the small bowel distension (2:50, 6:69. Suggestion of tiny fat containing renal hernias bilaterally. IMPRESSION: 1. Fluid-distension and fecalization sign of several loops of small bowel within the left upper/mid abdominal quadrant with possible partial/early obstruction at a paraumbilical Richter hernia containing a tiny segment of small bowel distally. No associated bowel perforation. No definite findings of bowel ischemia. 2. Possible tiny fat containing supraumbilical ventral wall hernia with an abdominal defect of 2 mm. 3.  Aortic Atherosclerosis (ICD10-I70.0). Electronically Signed   By: Iven Finn M.D.   On: 11/01/2020 03:54    Pertinent labs & imaging results that were available during my care of the patient were reviewed by me and considered in my medical decision making (see chart for details).  Medications Ordered in ED Medications  sodium chloride 0.9 % bolus 1,000 mL (0 mLs Intravenous Stopped 11/01/20 0409)    Followed by  0.9 %  sodium chloride infusion (1,000 mLs Intravenous New Bag/Given 11/01/20 0132)  HYDROmorphone (DILAUDID) 1 MG/ML injection (has no administration in time range)  ondansetron (ZOFRAN) injection 4 mg (4 mg Intravenous Given 11/01/20 0131)  HYDROmorphone (DILAUDID) injection 1 mg (1 mg Intravenous Given 11/01/20 0143)  iohexol (OMNIPAQUE) 300 MG/ML solution 100 mL (100 mLs Intravenous Contrast Given 11/01/20 0307)  HYDROmorphone (DILAUDID) injection 1 mg (1 mg Intravenous Given 11/01/20 0409)  Procedures Procedures  (including  critical care time)  Medical Decision Making / ED Course I have reviewed the nursing notes for this encounter and the patient's prior records (if available in EHR or on provided paperwork).   Dolores Mcgovern. was evaluated in Emergency Department on 11/01/2020 for the symptoms described in the history of present illness. He was evaluated in the context of the global COVID-19 pandemic, which necessitated consideration that the patient might be at risk for infection with the SARS-CoV-2 virus that causes COVID-19. Institutional protocols and algorithms that pertain to the evaluation of patients at risk for COVID-19 are in a state of rapid change based on information released by regulatory bodies including the CDC and federal and state organizations. These policies and algorithms were followed during the patient's care in the ED.    Clinical Course as of 11/01/20 0436  Nancy Fetter Nov 01, 2020  0434 Work up consistent with early/partial SBO. Provided with IVF, pain meds, and zofran. NGT placed. Admit to hospitalist for SBO protocol. Since it's low grade, EGS does not need to be involved at this time.  [PC]    Clinical Course User Index [PC] Sanari Offner, Grayce Sessions, MD     Final Clinical Impression(s) / ED Diagnoses Final diagnoses:  SBO (small bowel obstruction) ([place])      This chart was dictated using voice recognition software.  Despite best efforts to proofread,  errors can occur which can change the documentation meaning.   Fatima Blank, MD 11/01/20 [phone]

## 2020-11-01 NOTE — ED Notes (Signed)
Pt given dilaudid per request for transport - MD advised and order given Pt pain 7/10

## 2020-11-01 NOTE — ED Triage Notes (Addendum)
Pt reports 4-5 episodes of nausea/vomiting since 1900 yesterday. Pt reports history of small bowel obstruction. 4/10 abd pain. Intermediate episodes of abd pain.

## 2020-11-01 NOTE — Progress Notes (Signed)
Discharge instructions given to patient. Denies having any questions. NG tube and IV removed. Tolerated well.

## 2020-11-01 NOTE — Discharge Summary (Signed)
Physician Discharge Summary  Jermaine Walls. VZD:[id number] DOB: 26-Jan-1966   PCP: Vernie Shanks, MD  Admit date: 11/01/2020 Discharge date: 11/01/2020 Length of Stay: 0 days   Code Status: Full Code  Admitted From:  Home Discharged to:   [place]:  None  Equipment/Devices:  None Discharge Condition:  Stable  Recommendations for Outpatient Follow-up   1. Follow up lipid panel  2. 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  Associated with nausea and nonbloody nonbilious emesis and constipation since yesterday.  Improving symptoms with NG tube. States that his symptoms seem to be triggered by high fiber diets and had a large bucket of popcorn yesterday at the movies without thinking. He does not wish to be seen by general surgery as he wishes to avoid a consult fee.   ED Course: Afebrile, hemodynamically stable, on room air. Notable Labs: Labs overall unremarkable, COVID-19 negative. Notable Imaging: CT abdomen pelvis with contrast-possible partial/early obstruction at paraumbilical Richter hernia containing a tiny segment of small bowel distally, possible tiny fat-containing periumbilical ventral wall hernia, aortic atherosclerosis.  Patient received Dilaudid, Zofran, 1 L NS bolus with maintenance fluid.   NG tube placed at this time and surgery was not consulted as it was considered low-grade.   He remained NPO and throughout the day he had three bowel movements. Repeat abdominal xray per SBO protocol showed Contrast media present throughout the colon to the level of the rectum. No distended loops of bowel are noted. Patient stated that he felt much better and did not  wish to have a PO trial while inpatient and said he would go home to eat, despite being advised that he should try to eat something here. He has had this occur many times in the past and feels comfortable with management at home and is requesting to be discharged. I think at this point since he has had symptomatic improvement, having BMs and XR showed improvement, he should be able to be safely discharged with strict return precautions. The patient understood that he can have nausea/vomiting/recurrence of SBO and he is in agreement to return if any issues.    A & P   Principal Problem:   SBO (small bowel obstruction) (HCC) Active Problems:   Essential hypertension   Hypercholesterolemia   Idiopathic gout   Aortic atherosclerosis ([place])     1. Recurrent SBO s/p NG tube, improved  2. Hypertension 1. Continue home meds  3. Aortic atherosclerosis a. Not on statin b. Follow up Lipid panel  4. Gout, not in acute flare     Consultants  . Discussed with General surgery over the phone  Procedures  . NG tube  Antibiotics   Anti-infectives (From admission, onward)   None       Subjective  Patient seen and examined at bedside no acute distress and resting comfortably. He has had multiple BMs throughout the day and feels better, however he has not had anything to eat.  He wishes to go home.     Objective   Discharge Exam: Vitals:   11/01/20 1410 11/01/20 1818  BP: 115/80 132/89  Pulse: (!) 55 62  Resp: 17 18  Temp: 98.4 F (36.9 C) 98.7 F (  37.1 C)  SpO2: 96% 98%   Vitals:   11/01/20 0835 11/01/20 0943 11/01/20 1410 11/01/20 1818  BP: 116/87 102/73 115/80 132/89  Pulse: (!) 58 (!) 54 (!) 55 62  Resp: 16 17 17 18   Temp: 97.9 F (36.6 C) 98 F (36.7 C) 98.4 F (36.9 C) 98.7 F (37.1 C)  TempSrc: Oral Oral Oral Oral  SpO2: 97% 94% 96% 98%  Weight:      Height:        Physical Exam See PE from earlier today   The results of significant diagnostics  from this hospitalization (including imaging, microbiology, ancillary and laboratory) are listed below for reference.     Microbiology: Recent Results (from the past 240 hour(s))  Resp Panel by RT-PCR (Flu A&B, Covid) Nasopharyngeal Swab     Status: None   Collection Time: 11/01/20  1:25 AM   Specimen: Nasopharyngeal Swab; Nasopharyngeal(NP) swabs in vial transport medium  Result Value Ref Range Status   SARS Coronavirus 2 by RT PCR NEGATIVE NEGATIVE Final   Influenza A by PCR NEGATIVE NEGATIVE Final   Influenza B by PCR NEGATIVE NEGATIVE Final     Labs: BNP (last 3 results) No results for input(s): BNP in the last 8760 hours. Basic Metabolic Panel: Recent Labs  Lab 11/01/20 0125  NA 141  K 4.2  CL 104  CO2 28  GLUCOSE 131*  BUN 13  CREATININE 0.88  CALCIUM 9.8   Liver Function Tests: Recent Labs  Lab 11/01/20 0125  AST 23  ALT 28  ALKPHOS 49  BILITOT 0.5  PROT 7.0  ALBUMIN 4.7   Recent Labs  Lab 11/01/20 0125  LIPASE 24   No results for input(s): AMMONIA in the last 168 hours. CBC: Recent Labs  Lab 11/01/20 0125  WBC 10.3  NEUTROABS 8.0*  HGB 15.6  HCT 46.5  MCV 91.2  PLT 206   Cardiac Enzymes: No results for input(s): CKTOTAL, CKMB, CKMBINDEX, TROPONINI in the last 168 hours. BNP: Invalid input(s): POCBNP CBG: No results for input(s): GLUCAP in the last 168 hours. D-Dimer No results for input(s): DDIMER in the last 72 hours. Hgb A1c No results for input(s): HGBA1C in the last 72 hours. Lipid Profile No results for input(s): CHOL, HDL, LDLCALC, TRIG, CHOLHDL, LDLDIRECT in the last 72 hours. Thyroid function studies No results for input(s): TSH, T4TOTAL, T3FREE, THYROIDAB in the last 72 hours.  Invalid input(s): FREET3 Anemia work up No results for input(s): VITAMINB12, FOLATE, FERRITIN, TIBC, IRON, RETICCTPCT in the last 72 hours. Urinalysis    Component Value Date/Time   COLORURINE YELLOW 02/17/2019 2134   APPEARANCEUR CLEAR 02/17/2019  2134   LABSPEC 1.033 (H) 02/17/2019 2134   PHURINE 6.0 02/17/2019 2134   GLUCOSEU NEGATIVE 02/17/2019 2134   HGBUR NEGATIVE 02/17/2019 2134   HGBUR trace-lysed 02/08/2010 0935   BILIRUBINUR NEGATIVE 02/17/2019 2134   BILIRUBINUR n 02/25/2014 [place] 02/17/2019 2134   PROTEINUR NEGATIVE 02/17/2019 2134   UROBILINOGEN 0.2 02/25/2014 1531   UROBILINOGEN 0.2 02/08/2010 0935   NITRITE NEGATIVE 02/17/2019 2134   LEUKOCYTESUR NEGATIVE 02/17/2019 2134   Sepsis Labs Invalid input(s): PROCALCITONIN,  WBC,  LACTICIDVEN Microbiology Recent Results (from the past 240 hour(s))  Resp Panel by RT-PCR (Flu A&B, Covid) Nasopharyngeal Swab     Status: None   Collection Time: 11/01/20  1:25 AM   Specimen: Nasopharyngeal Swab; Nasopharyngeal(NP) swabs in vial transport medium  Result Value Ref Range Status   SARS Coronavirus 2 by  RT PCR NEGATIVE NEGATIVE Final   Influenza A by PCR NEGATIVE NEGATIVE Final   Influenza B by PCR NEGATIVE NEGATIVE Final    Discharge Instructions     Discharge Instructions    Diet - low sodium heart healthy   Complete by: As directed    Diet clear liquid   Complete by: As directed    Discharge instructions   Complete by: As directed    You were in the hospital with a small bowel obstruction and had improvement in your symptoms.  Though you had improvement in your symptoms you did not have a trial of food before leaving and so is unclear at this time if you would be able to tolerate foods.  If you have any recurrence of symptoms at home after eating please do not hesitate to return to the ED.   Increase activity slowly   Complete by: As directed      Allergies as of 11/01/2020      Reactions   Doxycycline Hyclate Itching, Rash      Medication List    TAKE these medications   allopurinol 300 MG tablet Commonly known as: ZYLOPRIM Take 300 mg by mouth daily.   amLODipine 5 MG tablet Commonly known as: NORVASC Take 5 mg by mouth 2 (two)  times daily.   aspirin EC 81 MG tablet Take 81 mg by mouth daily. Swallow whole.       Allergies  Allergen Reactions  . Doxycycline Hyclate Itching and Rash    Dispo: The patient is from: Home              Anticipated d/c is to: Home              Patient currently is medically stable to d/c.   Difficult to place patient No       Time coordinating discharge: under 30 minutes   SIGNED:   Harold Hedge, D.O. [facility] Pager: [phone]  11/01/2020, 7:27 PM

## 2020-11-20 ENCOUNTER — Encounter (HOSPITAL_BASED_OUTPATIENT_CLINIC_OR_DEPARTMENT_OTHER): Payer: No Typology Code available for payment source | Admitting: Internal Medicine

## 2020-12-24 MED FILL — Amlodipine Besylate Tab 5 MG (Base Equivalent): ORAL | 90 days supply | Qty: 180 | Fill #0 | Status: AC

## 2020-12-24 MED FILL — Allopurinol Tab 300 MG: ORAL | 90 days supply | Qty: 180 | Fill #0 | Status: AC

## 2020-12-25 ENCOUNTER — Other Ambulatory Visit (HOSPITAL_COMMUNITY): Payer: Self-pay

## 2021-01-14 ENCOUNTER — Other Ambulatory Visit (HOSPITAL_COMMUNITY): Payer: Self-pay

## 2021-01-14 MED ORDER — ACYCLOVIR 400 MG PO TABS
400.0000 mg | ORAL_TABLET | Freq: Three times a day (TID) | ORAL | 2 refills | Status: AC | PRN
  Filled 2021-01-14: qty 30, 10d supply, fill #0

## 2021-01-14 MED ORDER — PROMETHAZINE HCL 25 MG PO TABS
25.0000 mg | ORAL_TABLET | Freq: Three times a day (TID) | ORAL | 1 refills | Status: DC | PRN
  Filled 2021-01-14: qty 15, 5d supply, fill #0

## 2021-01-25 ENCOUNTER — Other Ambulatory Visit (HOSPITAL_COMMUNITY): Payer: Self-pay

## 2021-03-12 ENCOUNTER — Other Ambulatory Visit: Payer: Self-pay

## 2021-05-07 ENCOUNTER — Other Ambulatory Visit (HOSPITAL_COMMUNITY): Payer: Self-pay

## 2021-05-07 MED FILL — Amlodipine Besylate Tab 5 MG (Base Equivalent): ORAL | 90 days supply | Qty: 180 | Fill #1 | Status: AC

## 2021-06-18 ENCOUNTER — Other Ambulatory Visit (HOSPITAL_COMMUNITY): Payer: Self-pay

## 2021-06-18 MED ORDER — ALLOPURINOL 300 MG PO TABS
300.0000 mg | ORAL_TABLET | Freq: Every day | ORAL | 1 refills | Status: DC
  Filled 2021-06-18: qty 90, 90d supply, fill #0
  Filled 2021-09-15: qty 90, 90d supply, fill #1

## 2021-07-22 ENCOUNTER — Other Ambulatory Visit (HOSPITAL_COMMUNITY): Payer: Self-pay

## 2021-07-22 MED ORDER — VALSARTAN 40 MG PO TABS
40.0000 mg | ORAL_TABLET | Freq: Every day | ORAL | 3 refills | Status: DC
Start: 2021-07-22 — End: 2021-10-15
  Filled 2021-07-22: qty 30, 30d supply, fill #0
  Filled 2021-08-18: qty 30, 30d supply, fill #1
  Filled 2021-09-15: qty 30, 30d supply, fill #2

## 2021-07-22 MED ORDER — CITALOPRAM HYDROBROMIDE 20 MG PO TABS
20.0000 mg | ORAL_TABLET | Freq: Every day | ORAL | 3 refills | Status: DC
  Filled 2021-07-22: qty 30, 30d supply, fill #0
  Filled 2021-08-18: qty 30, 30d supply, fill #1
  Filled 2021-09-15: qty 30, 30d supply, fill #2

## 2021-07-22 MED ORDER — AMLODIPINE BESYLATE 5 MG PO TABS
5.0000 mg | ORAL_TABLET | Freq: Two times a day (BID) | ORAL | 3 refills | Status: DC
  Filled 2021-07-22 – 2021-08-13 (×2): qty 180, 90d supply, fill #0
  Filled 2021-12-01: qty 180, 90d supply, fill #1
  Filled 2022-03-18: qty 180, 90d supply, fill #2

## 2021-08-13 ENCOUNTER — Other Ambulatory Visit (HOSPITAL_COMMUNITY): Payer: Self-pay

## 2021-08-19 ENCOUNTER — Other Ambulatory Visit (HOSPITAL_COMMUNITY): Payer: Self-pay

## 2021-09-16 ENCOUNTER — Other Ambulatory Visit (HOSPITAL_COMMUNITY): Payer: Self-pay

## 2021-10-15 ENCOUNTER — Other Ambulatory Visit (HOSPITAL_COMMUNITY): Payer: Self-pay

## 2021-10-15 MED ORDER — VALSARTAN 80 MG PO TABS
80.0000 mg | ORAL_TABLET | Freq: Every day | ORAL | 3 refills | Status: DC
  Filled 2021-10-15: qty 90, 90d supply, fill #0

## 2021-10-15 MED ORDER — CITALOPRAM HYDROBROMIDE 20 MG PO TABS
20.0000 mg | ORAL_TABLET | Freq: Every day | ORAL | 3 refills | Status: DC
  Filled 2021-10-15: qty 90, 90d supply, fill #0

## 2021-12-02 ENCOUNTER — Other Ambulatory Visit (HOSPITAL_COMMUNITY): Payer: Self-pay

## 2021-12-02 MED ORDER — VALSARTAN 160 MG PO TABS
160.0000 mg | ORAL_TABLET | Freq: Every day | ORAL | 3 refills | Status: DC
  Filled 2021-12-02: qty 90, 90d supply, fill #0
  Filled 2022-03-24: qty 90, 90d supply, fill #1

## 2021-12-02 MED ORDER — CITALOPRAM HYDROBROMIDE 30 MG PO CAPS
30.0000 mg | ORAL_CAPSULE | Freq: Every day | ORAL | 2 refills | Status: DC
  Filled 2021-12-02: qty 90, 90d supply, fill #0

## 2021-12-03 ENCOUNTER — Other Ambulatory Visit (HOSPITAL_COMMUNITY): Payer: Self-pay

## 2021-12-03 MED ORDER — CITALOPRAM HYDROBROMIDE 20 MG PO TABS
30.0000 mg | ORAL_TABLET | Freq: Every day | ORAL | 2 refills | Status: DC
Start: 2021-12-03 — End: 2023-03-09
  Filled 2021-12-03: qty 135, 90d supply, fill #0

## 2021-12-06 ENCOUNTER — Other Ambulatory Visit (HOSPITAL_COMMUNITY): Payer: Self-pay

## 2021-12-14 ENCOUNTER — Other Ambulatory Visit (HOSPITAL_COMMUNITY): Payer: Self-pay

## 2021-12-17 ENCOUNTER — Other Ambulatory Visit (HOSPITAL_COMMUNITY): Payer: Self-pay

## 2021-12-23 ENCOUNTER — Other Ambulatory Visit (HOSPITAL_COMMUNITY): Payer: Self-pay

## 2021-12-23 MED ORDER — ALLOPURINOL 300 MG PO TABS
300.0000 mg | ORAL_TABLET | Freq: Every day | ORAL | 0 refills | Status: DC
  Filled 2021-12-23: qty 90, 90d supply, fill #0

## 2022-03-18 ENCOUNTER — Other Ambulatory Visit (HOSPITAL_COMMUNITY): Payer: Self-pay

## 2022-03-24 ENCOUNTER — Other Ambulatory Visit (HOSPITAL_COMMUNITY): Payer: Self-pay

## 2022-03-24 MED ORDER — ALLOPURINOL 300 MG PO TABS
300.0000 mg | ORAL_TABLET | Freq: Every day | ORAL | 0 refills | Status: DC
  Filled 2022-03-24: qty 90, 90d supply, fill #0

## 2022-03-25 ENCOUNTER — Other Ambulatory Visit (HOSPITAL_COMMUNITY): Payer: Self-pay

## 2022-05-13 ENCOUNTER — Other Ambulatory Visit (HOSPITAL_COMMUNITY): Payer: Self-pay

## 2022-05-13 MED ORDER — ALLOPURINOL 300 MG PO TABS
300.0000 mg | ORAL_TABLET | Freq: Every day | ORAL | 0 refills | Status: DC
  Filled 2022-05-13: qty 90, 90d supply, fill #0

## 2022-05-13 MED ORDER — SERTRALINE HCL 50 MG PO TABS
50.0000 mg | ORAL_TABLET | Freq: Every day | ORAL | 1 refills | Status: DC
  Filled 2022-05-13: qty 90, 90d supply, fill #0

## 2022-05-13 MED ORDER — AMLODIPINE BESYLATE 5 MG PO TABS
5.0000 mg | ORAL_TABLET | Freq: Every day | ORAL | 1 refills | Status: DC
  Filled 2022-05-13: qty 90, 90d supply, fill #0

## 2022-05-30 IMAGING — DX DG CHEST 1V PORT
1 series · 1 of 1 positions shown · non-contrast
Comparison: Chest radiographs 05/16/2016.

CLINICAL DATA: 54-year-old male NG tube placement.

EXAM:
PORTABLE CHEST 1 VIEW

[chest]
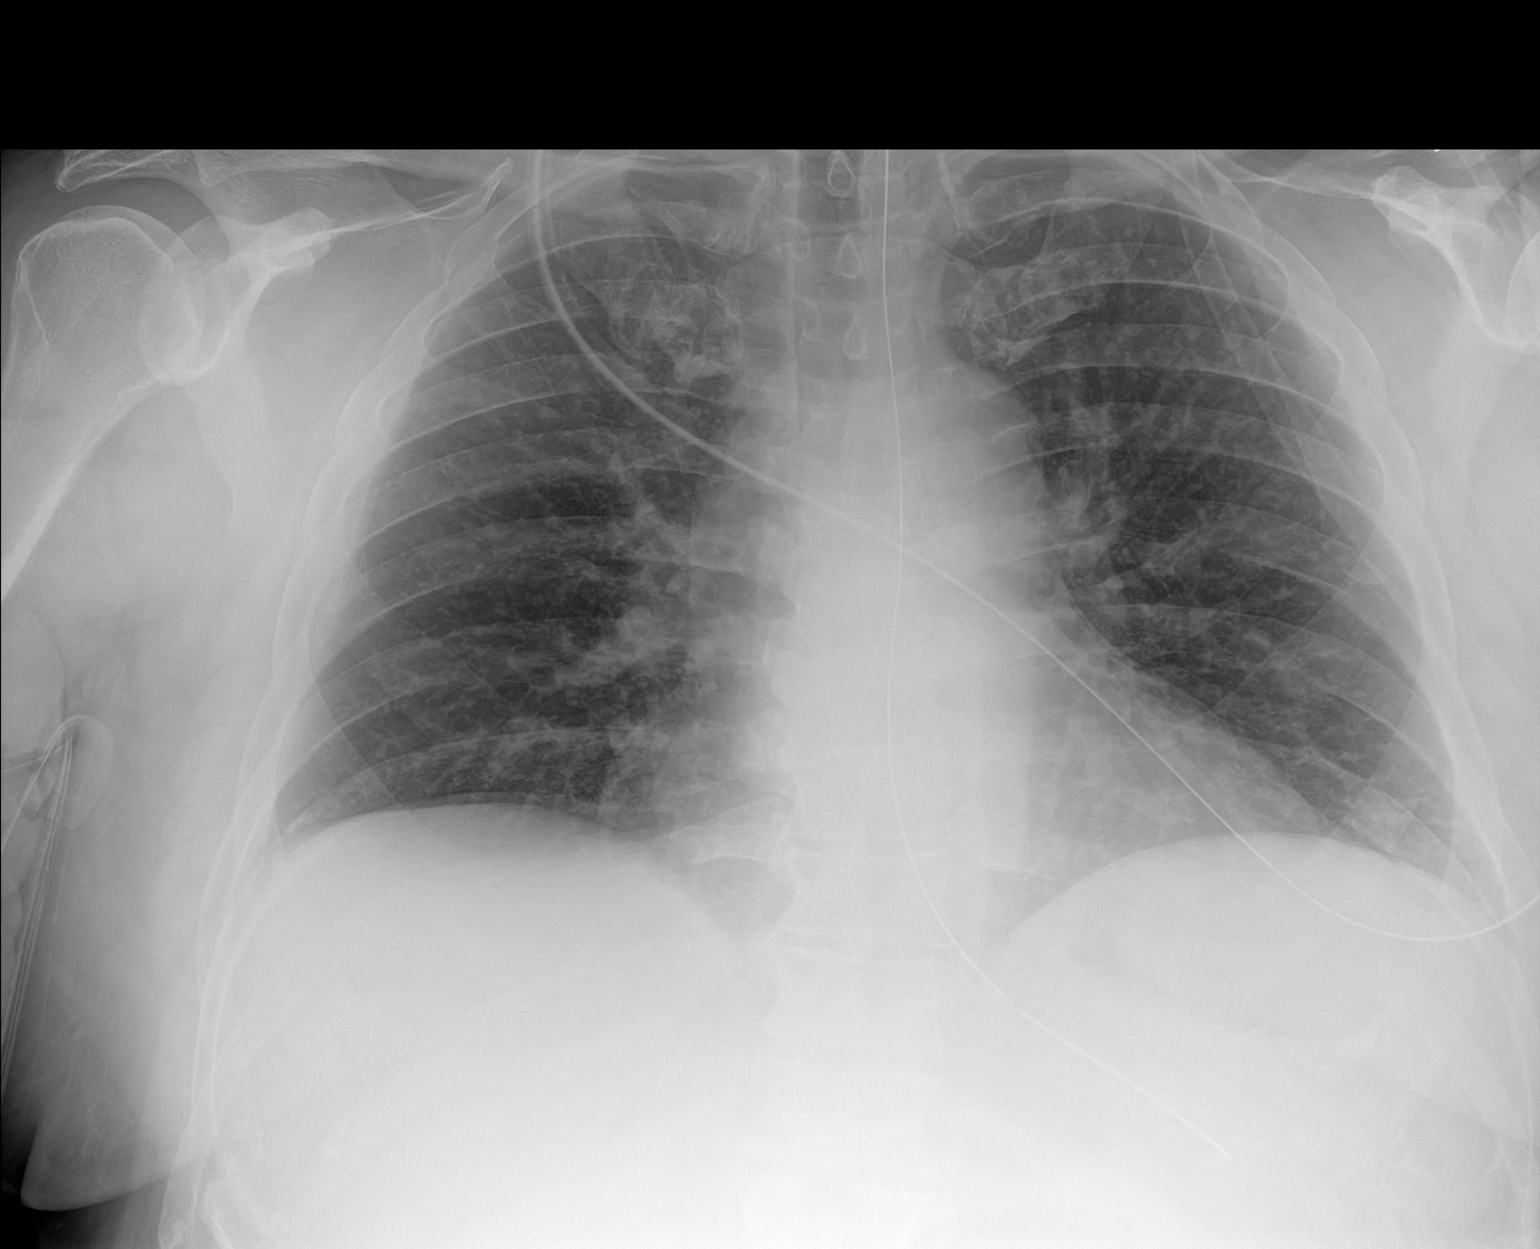

[1 of 1 positions shown; findings below may reference images not displayed]

FINDINGS: Portable AP upright view at 1885 hours. Enteric tube placed into the
stomach, although the side hole projects near, perhaps just inside
the gastroesophageal junction.

Lung volumes and mediastinal contours are stable, within normal
limits. Visualized tracheal air column is within normal limits.
Allowing for portable technique the lungs are clear. No
pneumoperitoneum. Paucity of bowel gas in the upper abdomen. No
acute osseous abnormality identified.
IMPRESSION: 1. Enteric tube placed into the stomach, although side hole near the
gastroesophageal junction. Advance 5 cm to ensure side hole
placement within the stomach.
2. No acute cardiopulmonary abnormality.

## 2022-05-30 IMAGING — CT CT ABD-PELV W/ CM
1 of 3 series · 13 of 32 positions shown, 18 images · IV contrast (omnipaque)
Comparison: CT abdomen pelvis 03/19/2019

CLINICAL DATA: Bowel obstruction suspected. Episodes of nausea and
vomiting since yesterday. Abdominal pain.

EXAM:
CT ABDOMEN AND PELVIS WITH CONTRAST
TECHNIQUE: Multidetector CT imaging of the abdomen and pelvis was performed
using the standard protocol following bolus administration of
intravenous contrast.
CONTRAST:  100mL OMNIPAQUE IOHEXOL 300 MG/ML  SOLN

[Series 2: abd pel w · axial · 0.88mm/px · z∈[+935,+1360]mm · 13 of 96 slices shown, 18 images]
[im 6/96  soft-tissue]
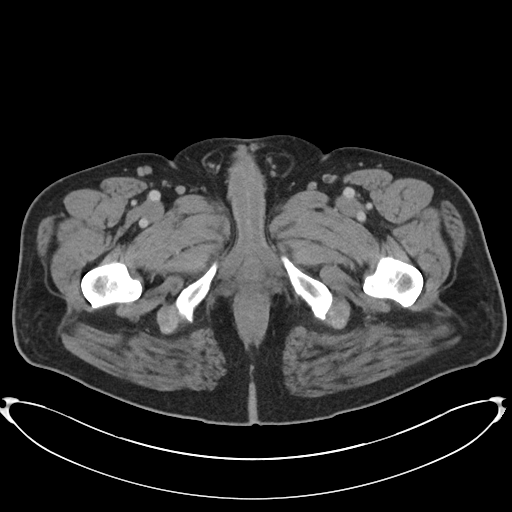
[im 6/96  bone]
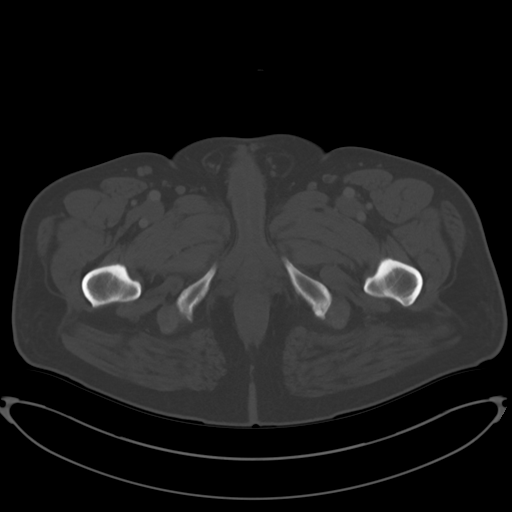
[im 16/96  soft-tissue]
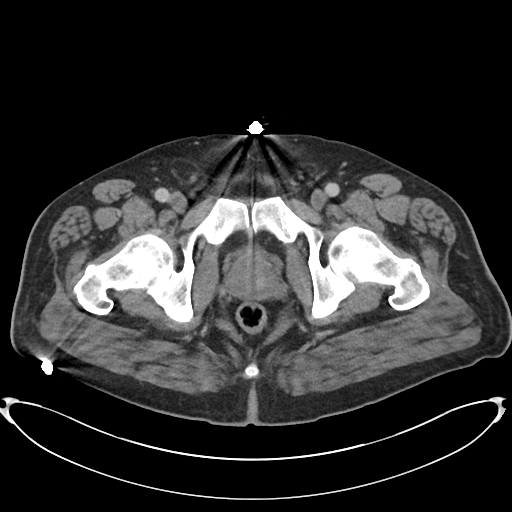
[im 21/96  soft-tissue]
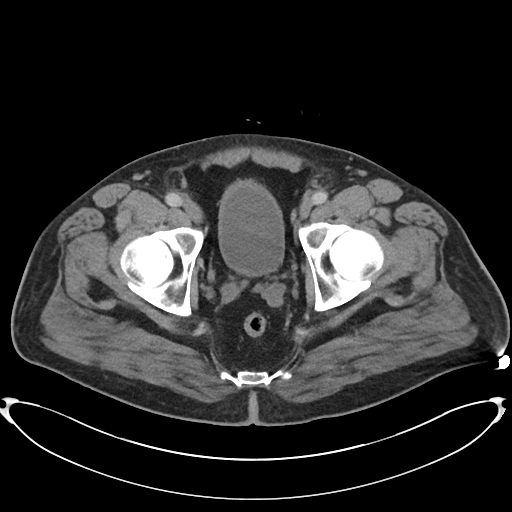
[im 31/96  soft-tissue]
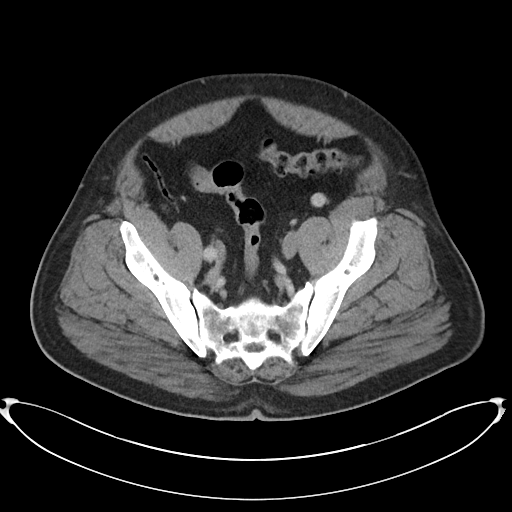
[im 36/96  soft-tissue]
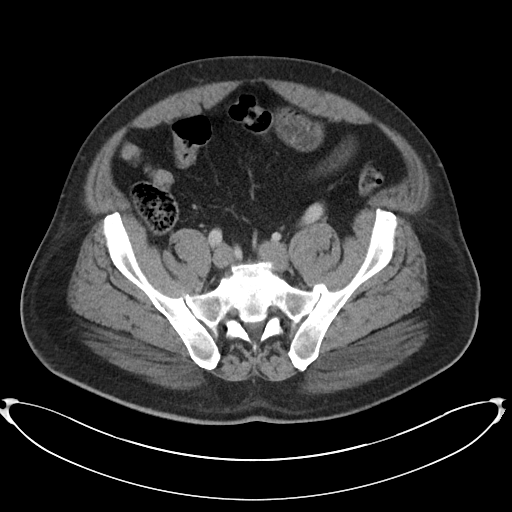
[im 46/96  soft-tissue]
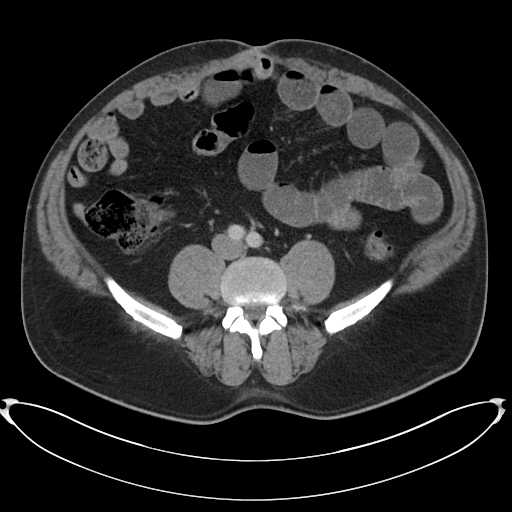
[im 51/96  soft-tissue]
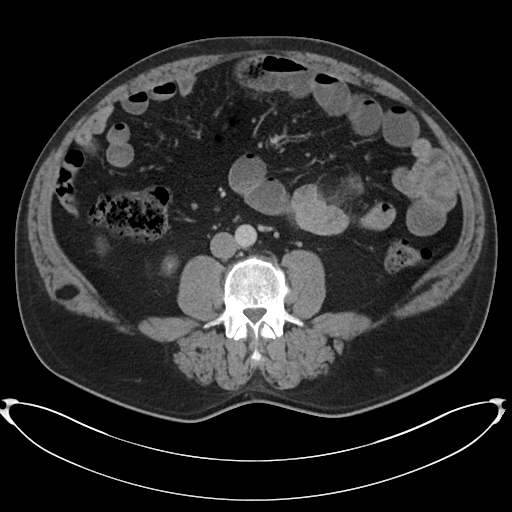
[im 61/96  soft-tissue]
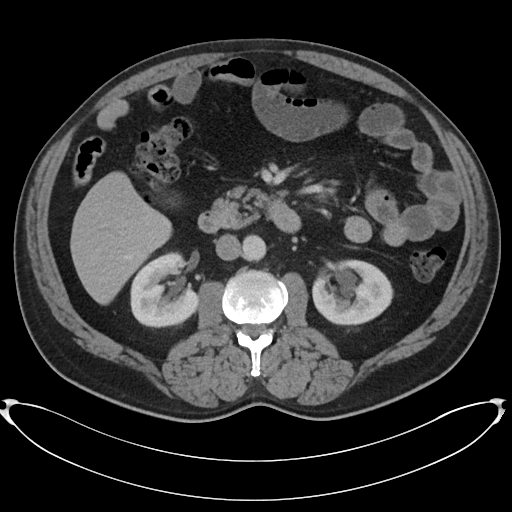
[im 66/96  soft-tissue]
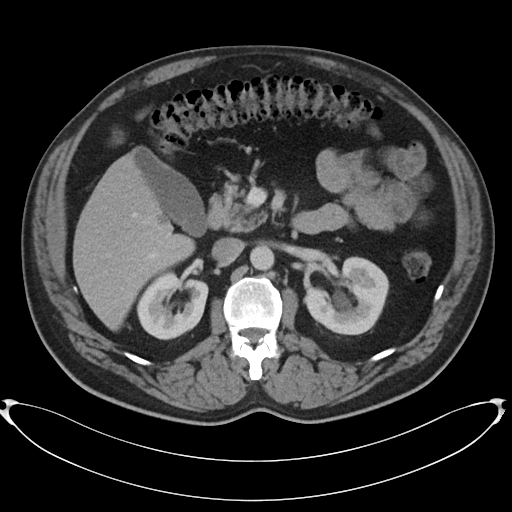
[im 66/96  bone]
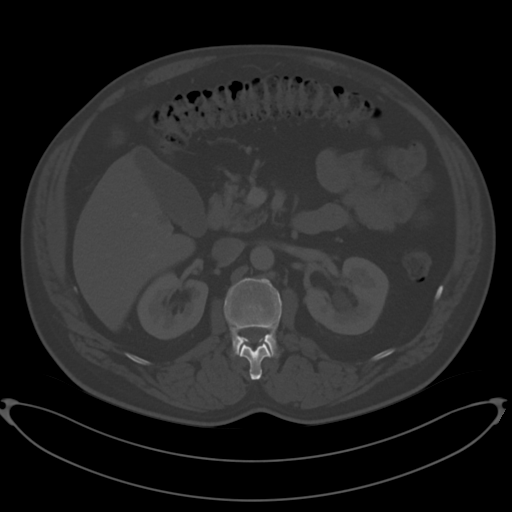
[im 76/96  soft-tissue]
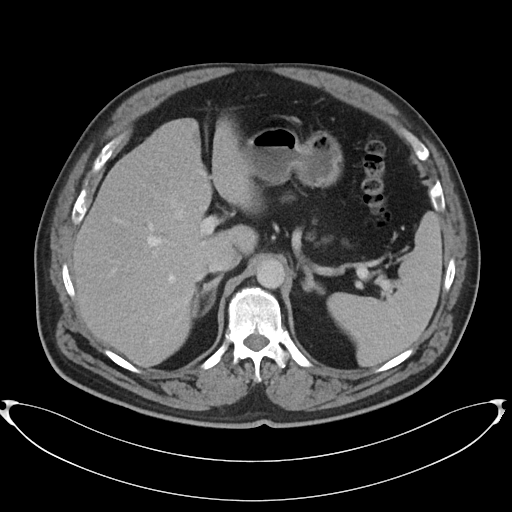
[im 76/96  lung]
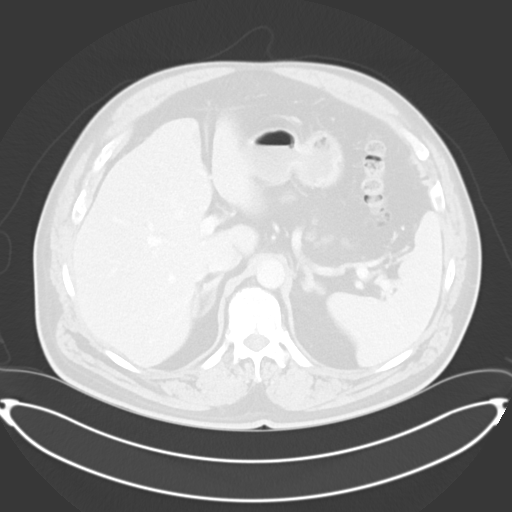
[im 81/96  soft-tissue]
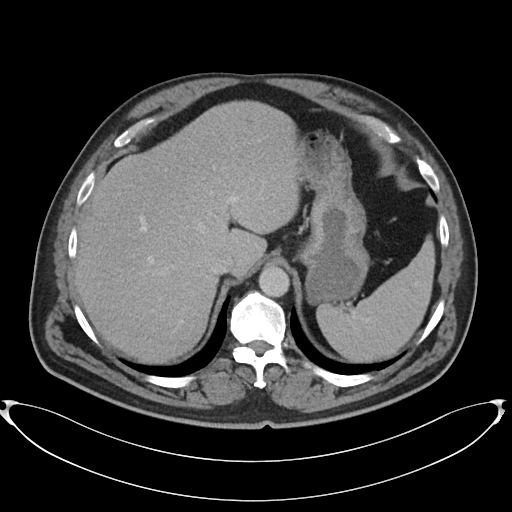
[im 81/96  lung]
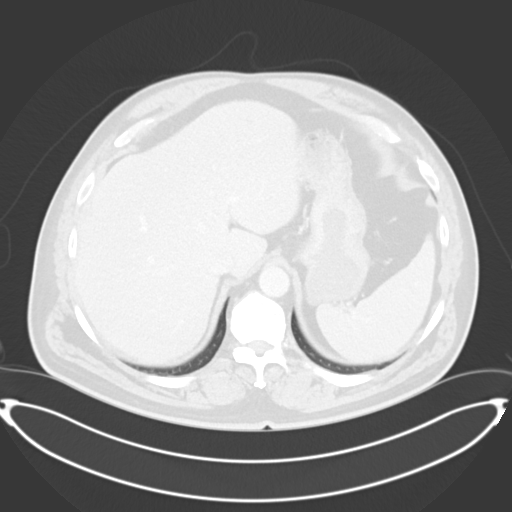
[im 86/96  lung]
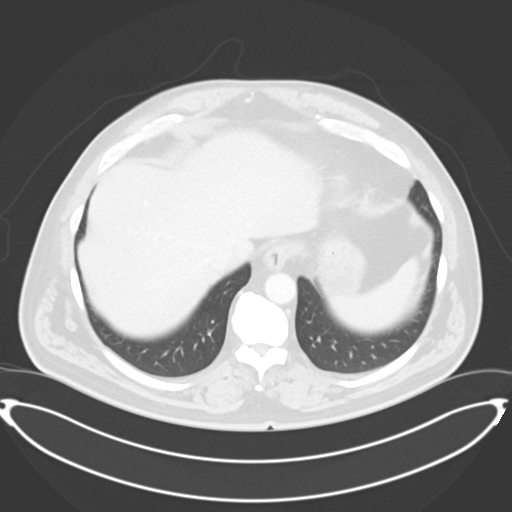
[im 91/96  soft-tissue]
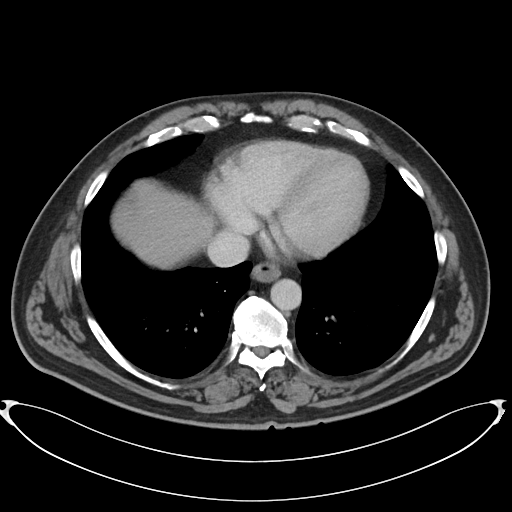
[im 91/96  lung]
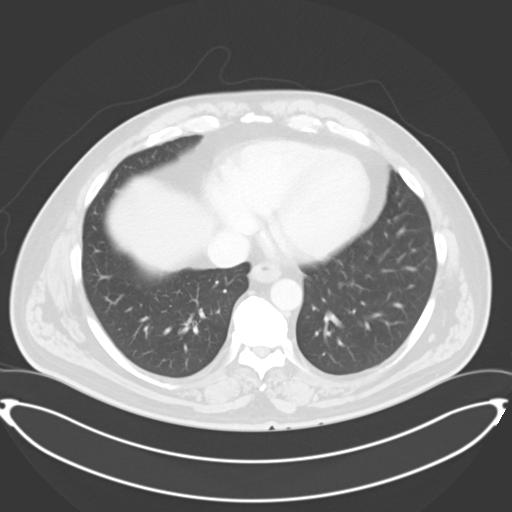

[13 of 32 positions shown; findings below may reference images not displayed]

FINDINGS: Lower chest: No acute abnormality.

Hepatobiliary: Subcentimeter hypodensity within left hepatic lobe is
again noted and is too small to characterize. No focal liver
abnormality. No gallstones, gallbladder wall thickening, or
pericholecystic fluid. No biliary dilatation.

Pancreas: No focal lesion. Normal pancreatic contour. No surrounding
inflammatory changes. No main pancreatic ductal dilatation.

Spleen: Normal in size without focal abnormality.

Adrenals/Urinary Tract: No adrenal nodule bilaterally. Bilateral
kidneys enhance symmetrically. No hydronephrosis. No hydroureter.
The urinary bladder is unremarkable. On delayed imaging, there is no
urothelial wall thickening and there are no filling defects in the
opacified portions of the bilateral collecting systems or ureters.

Stomach/Bowel: Stomach is within normal limits. Similar-appearing to
a lesser extent fluid distension, with no dilatation or pneumatosis,
a several loops of proximal/mid small bowel within the left abdomen.
Fecalization fatigue sign is also noted within several loops of
small bowel. No associated transition point. No definite evidence of
bowel wall thickening or dilatation. Appendix appears normal.

Vascular/Lymphatic: No abdominal aorta or iliac aneurysm. Mild
atherosclerotic plaque of the aorta and its branches. No abdominal,
pelvic, or inguinal lymphadenopathy.

Reproductive: Prostate is unremarkable.

Other: No intraperitoneal free fluid. No intraperitoneal free gas.
No organized fluid collection.

Musculoskeletal: Possible tiny fat containing supraumbilical ventral
wall hernia with an abdominal defect of 2 mm ([DATE]). Possible tiny
paraumbilical Richter hernia containing a short portion of the small
bowel that is noted to be distal to the small bowel distension
([DATE], [DATE]. Suggestion of tiny fat containing renal hernias
bilaterally.
IMPRESSION: 1. Fluid-distension and fecalization sign of several loops of small
bowel within the left upper/mid abdominal quadrant with possible
partial/early obstruction at a paraumbilical Richter hernia
containing a tiny segment of small bowel distally. No associated
bowel perforation. No definite findings of bowel ischemia.
2. Possible tiny fat containing supraumbilical ventral wall hernia
with an abdominal defect of 2 mm.
3.  Aortic Atherosclerosis ([id number].F).

## 2022-06-17 ENCOUNTER — Other Ambulatory Visit (HOSPITAL_COMMUNITY): Payer: Self-pay

## 2022-06-17 MED ORDER — SERTRALINE HCL 100 MG PO TABS
100.0000 mg | ORAL_TABLET | Freq: Every day | ORAL | 1 refills | Status: DC
  Filled 2022-06-17: qty 90, 90d supply, fill #0

## 2022-06-17 MED ORDER — AMLODIPINE BESYLATE 10 MG PO TABS
10.0000 mg | ORAL_TABLET | Freq: Every day | ORAL | 1 refills | Status: DC
  Filled 2022-06-17: qty 90, 90d supply, fill #0
  Filled 2022-10-16: qty 90, 90d supply, fill #1

## 2022-06-28 ENCOUNTER — Other Ambulatory Visit (HOSPITAL_COMMUNITY): Payer: Self-pay

## 2022-06-28 MED ORDER — VALSARTAN 320 MG PO TABS
320.0000 mg | ORAL_TABLET | Freq: Every evening | ORAL | 0 refills | Status: DC
  Filled 2022-06-28: qty 90, 90d supply, fill #0

## 2022-06-29 ENCOUNTER — Other Ambulatory Visit (HOSPITAL_COMMUNITY): Payer: Self-pay

## 2022-08-18 ENCOUNTER — Other Ambulatory Visit (HOSPITAL_COMMUNITY): Payer: Self-pay

## 2022-08-18 MED ORDER — ALLOPURINOL 300 MG PO TABS
300.0000 mg | ORAL_TABLET | Freq: Every day | ORAL | 1 refills | Status: DC
  Filled 2022-08-18: qty 90, 90d supply, fill #0
  Filled 2022-11-23: qty 90, 90d supply, fill #1

## 2022-09-29 ENCOUNTER — Other Ambulatory Visit (HOSPITAL_COMMUNITY): Payer: Self-pay

## 2022-09-29 MED ORDER — PROMETHAZINE HCL 25 MG PO TABS
25.0000 mg | ORAL_TABLET | Freq: Three times a day (TID) | ORAL | 0 refills | Status: DC | PRN
  Filled 2022-09-29: qty 15, 5d supply, fill #0

## 2022-09-29 MED ORDER — VALSARTAN 320 MG PO TABS
320.0000 mg | ORAL_TABLET | Freq: Every evening | ORAL | 1 refills | Status: DC
  Filled 2022-09-29: qty 90, 90d supply, fill #0
  Filled 2023-01-05: qty 90, 90d supply, fill #1

## 2022-11-23 ENCOUNTER — Other Ambulatory Visit: Payer: Self-pay

## 2022-12-27 ENCOUNTER — Encounter: Payer: Self-pay | Admitting: Internal Medicine

## 2023-01-05 ENCOUNTER — Encounter: Payer: Self-pay | Admitting: Internal Medicine

## 2023-01-05 DIAGNOSIS — E7800 Pure hypercholesterolemia, unspecified: Secondary | ICD-10-CM | POA: Diagnosis not present

## 2023-01-05 DIAGNOSIS — I10 Essential (primary) hypertension: Secondary | ICD-10-CM | POA: Diagnosis not present

## 2023-01-05 DIAGNOSIS — R3915 Urgency of urination: Secondary | ICD-10-CM | POA: Diagnosis not present

## 2023-01-05 DIAGNOSIS — Z8739 Personal history of other diseases of the musculoskeletal system and connective tissue: Secondary | ICD-10-CM | POA: Diagnosis not present

## 2023-01-05 DIAGNOSIS — Z1211 Encounter for screening for malignant neoplasm of colon: Secondary | ICD-10-CM | POA: Diagnosis not present

## 2023-01-05 DIAGNOSIS — Z125 Encounter for screening for malignant neoplasm of prostate: Secondary | ICD-10-CM | POA: Diagnosis not present

## 2023-01-05 DIAGNOSIS — Z0000 Encounter for general adult medical examination without abnormal findings: Secondary | ICD-10-CM | POA: Diagnosis not present

## 2023-01-06 ENCOUNTER — Other Ambulatory Visit (HOSPITAL_COMMUNITY): Payer: Self-pay

## 2023-01-12 ENCOUNTER — Other Ambulatory Visit (HOSPITAL_COMMUNITY): Payer: Self-pay

## 2023-01-12 DIAGNOSIS — L858 Other specified epidermal thickening: Secondary | ICD-10-CM | POA: Diagnosis not present

## 2023-01-12 DIAGNOSIS — D2262 Melanocytic nevi of left upper limb, including shoulder: Secondary | ICD-10-CM | POA: Diagnosis not present

## 2023-01-12 DIAGNOSIS — D235 Other benign neoplasm of skin of trunk: Secondary | ICD-10-CM | POA: Diagnosis not present

## 2023-01-12 DIAGNOSIS — D2261 Melanocytic nevi of right upper limb, including shoulder: Secondary | ICD-10-CM | POA: Diagnosis not present

## 2023-01-12 DIAGNOSIS — D2361 Other benign neoplasm of skin of right upper limb, including shoulder: Secondary | ICD-10-CM | POA: Diagnosis not present

## 2023-01-12 DIAGNOSIS — D1801 Hemangioma of skin and subcutaneous tissue: Secondary | ICD-10-CM | POA: Diagnosis not present

## 2023-01-12 DIAGNOSIS — L814 Other melanin hyperpigmentation: Secondary | ICD-10-CM | POA: Diagnosis not present

## 2023-01-12 DIAGNOSIS — L720 Epidermal cyst: Secondary | ICD-10-CM | POA: Diagnosis not present

## 2023-01-12 DIAGNOSIS — D225 Melanocytic nevi of trunk: Secondary | ICD-10-CM | POA: Diagnosis not present

## 2023-01-12 DIAGNOSIS — D485 Neoplasm of uncertain behavior of skin: Secondary | ICD-10-CM | POA: Diagnosis not present

## 2023-01-12 MED ORDER — TRIAMCINOLONE ACETONIDE 0.1 % EX CREA
1.0000 | TOPICAL_CREAM | Freq: Two times a day (BID) | CUTANEOUS | 0 refills | Status: AC | PRN
  Filled 2023-01-12: qty 30, 30d supply, fill #0

## 2023-01-19 ENCOUNTER — Other Ambulatory Visit (HOSPITAL_COMMUNITY): Payer: Self-pay

## 2023-01-19 MED ORDER — AMLODIPINE BESYLATE 10 MG PO TABS
10.0000 mg | ORAL_TABLET | Freq: Every morning | ORAL | 3 refills | Status: DC
  Filled 2023-01-19: qty 90, 90d supply, fill #0
  Filled 2023-04-12: qty 90, 90d supply, fill #1
  Filled 2023-07-26: qty 90, 90d supply, fill #2
  Filled 2023-10-25: qty 90, 90d supply, fill #3

## 2023-02-24 ENCOUNTER — Other Ambulatory Visit (HOSPITAL_COMMUNITY): Payer: Self-pay

## 2023-02-24 MED ORDER — ALLOPURINOL 300 MG PO TABS
300.0000 mg | ORAL_TABLET | Freq: Every day | ORAL | 3 refills | Status: DC
  Filled 2023-02-24: qty 90, 90d supply, fill #0
  Filled 2023-05-11 – 2023-05-25 (×2): qty 90, 90d supply, fill #1
  Filled 2023-08-31: qty 90, 90d supply, fill #2
  Filled 2023-11-29: qty 90, 90d supply, fill #3

## 2023-03-09 ENCOUNTER — Other Ambulatory Visit: Payer: Self-pay

## 2023-03-09 ENCOUNTER — Other Ambulatory Visit (HOSPITAL_COMMUNITY): Payer: Self-pay

## 2023-03-09 ENCOUNTER — Ambulatory Visit (AMBULATORY_SURGERY_CENTER): Payer: 59

## 2023-03-09 VITALS — Ht 70.0 in | Wt 250.0 lb

## 2023-03-09 DIAGNOSIS — Z86010 Personal history of colonic polyps: Secondary | ICD-10-CM

## 2023-03-09 MED ORDER — NA SULFATE-K SULFATE-MG SULF 17.5-3.13-1.6 GM/177ML PO SOLN
1.0000 | Freq: Once | ORAL | 0 refills | Status: AC
Start: 2023-03-09 — End: 2023-03-24
  Filled 2023-03-09 – 2023-03-23 (×2): qty 354, 1d supply, fill #0

## 2023-03-09 NOTE — Progress Notes (Signed)
Denies allergies to eggs or soy products. Denies complication of anesthesia or sedation. Denies use of weight loss medication. Denies use of O2.    

## 2023-03-20 ENCOUNTER — Encounter: Payer: Self-pay | Admitting: Internal Medicine

## 2023-03-22 ENCOUNTER — Other Ambulatory Visit (HOSPITAL_COMMUNITY): Payer: Self-pay

## 2023-03-23 ENCOUNTER — Other Ambulatory Visit (HOSPITAL_COMMUNITY): Payer: Self-pay

## 2023-03-31 ENCOUNTER — Encounter: Payer: Self-pay | Admitting: Internal Medicine

## 2023-03-31 ENCOUNTER — Ambulatory Visit (AMBULATORY_SURGERY_CENTER): Payer: 59 | Admitting: Internal Medicine

## 2023-03-31 VITALS — BP 122/70 | HR 54 | Temp 98.1°F | Resp 18 | Ht 70.0 in | Wt 250.0 lb

## 2023-03-31 DIAGNOSIS — D120 Benign neoplasm of cecum: Secondary | ICD-10-CM

## 2023-03-31 DIAGNOSIS — D125 Benign neoplasm of sigmoid colon: Secondary | ICD-10-CM

## 2023-03-31 DIAGNOSIS — D122 Benign neoplasm of ascending colon: Secondary | ICD-10-CM | POA: Diagnosis not present

## 2023-03-31 DIAGNOSIS — I10 Essential (primary) hypertension: Secondary | ICD-10-CM | POA: Diagnosis not present

## 2023-03-31 DIAGNOSIS — D124 Benign neoplasm of descending colon: Secondary | ICD-10-CM

## 2023-03-31 DIAGNOSIS — D123 Benign neoplasm of transverse colon: Secondary | ICD-10-CM | POA: Diagnosis not present

## 2023-03-31 DIAGNOSIS — Z09 Encounter for follow-up examination after completed treatment for conditions other than malignant neoplasm: Secondary | ICD-10-CM

## 2023-03-31 DIAGNOSIS — Z86010 Personal history of colonic polyps: Secondary | ICD-10-CM

## 2023-03-31 DIAGNOSIS — Z1211 Encounter for screening for malignant neoplasm of colon: Secondary | ICD-10-CM | POA: Diagnosis not present

## 2023-03-31 MED ORDER — SODIUM CHLORIDE 0.9 % IV SOLN
500.0000 mL | Freq: Once | INTRAVENOUS | Status: DC
Start: 2023-03-31 — End: 2023-03-31

## 2023-03-31 NOTE — Progress Notes (Signed)
Report to PACU, RN, vss, BBS= Clear.  

## 2023-03-31 NOTE — Progress Notes (Signed)
Called to room to assist during endoscopic procedure.  Patient ID and intended procedure confirmed with present staff. Received instructions for my participation in the procedure from the performing physician.  

## 2023-03-31 NOTE — Patient Instructions (Addendum)
Read all of the handouts given to you by your recovery room nurse.   We will be in touch regarding your repeat colonoscopy.  YOU HAD AN ENDOSCOPIC PROCEDURE TODAY AT THE [facility]:   Refer to the procedure report that was given to you for any specific questions about what was found during the examination.  If the procedure report does not answer your questions, please call your gastroenterologist to clarify.  If you requested that your care partner not be given the details of your procedure findings, then the procedure report has been included in a sealed envelope for you to review at your convenience later.  YOU SHOULD EXPECT: Some feelings of bloating in the abdomen. Passage of more gas than usual.  Walking can help get rid of the air that was put into your GI tract during the procedure and reduce the bloating. If you had a lower endoscopy (such as a colonoscopy or flexible sigmoidoscopy) you may notice spotting of blood in your stool or on the toilet paper. If you underwent a bowel prep for your procedure, you may not have a normal bowel movement for a few days.  Please Note:  You might notice some irritation and congestion in your nose or some drainage.  This is from the oxygen used during your procedure.  There is no need for concern and it should clear up in a day or so.  SYMPTOMS TO REPORT IMMEDIATELY:  Following lower endoscopy (colonoscopy or flexible sigmoidoscopy):  Excessive amounts of blood in the stool  Significant tenderness or worsening of abdominal pains  Swelling of the abdomen that is new, acute  Fever of 100F or higher   For urgent or emergent issues, a gastroenterologist can be reached at any hour by calling [phone]. Do not use MyChart messaging for urgent concerns.    DIET:  We do recommend a small meal at first, but then you may proceed to your regular diet.  Drink plenty of fluids but you should avoid alcoholic beverages for 24 hours.  Try to eat a  high fiber diet, and drink plenty of water.  ACTIVITY:  You should plan to take it easy for the rest of today and you should NOT DRIVE or use heavy machinery until tomorrow (because of the sedation medicines used during the test).    FOLLOW UP: Our staff will call the number listed on your records the next business day following your procedure.  We will call around 7:15- 8:00 am to check on you and address any questions or concerns that you may have regarding the information given to you following your procedure. If we do not reach you, we will leave a message.     If any biopsies were taken you will be contacted by phone or by letter within the next 1-3 weeks.  Please call us at [phone] if you have not heard about the biopsies in 3 weeks.    SIGNATURES/CONFIDENTIALITY: You and/or your care partner have signed paperwork which will be entered into your electronic medical record.  These signatures attest to the fact that that the information above on your After Visit Summary has been reviewed and is understood.  Full responsibility of the confidentiality of this discharge information lies with you and/or your care-partner.

## 2023-03-31 NOTE — Progress Notes (Signed)
Pt's states no medical or surgical changes since previsit or office visit. 

## 2023-03-31 NOTE — Op Note (Signed)
[facility] Patient Name: Jermaine Walls Procedure Date: 03/31/2023 8:01 AM MRN: [id number] Endoscopist: Beverley Fiedler , MD, [id number] Age: 57 Referring MD:  Date of Birth: [birth year] Gender: Male Account #: [id number] Procedure:                Colonoscopy Indications:              High risk colon cancer surveillance: Personal                            history of non-advanced adenomas, Last colonoscopy:                            April 2019 (adenoma x 2) Medicines:                Monitored Anesthesia Care Procedure:                Pre-Anesthesia Assessment:                           - Prior to the procedure, a History and Physical                            was performed, and patient medications and                            allergies were reviewed. The patient's tolerance of                            previous anesthesia was also reviewed. The risks                            and benefits of the procedure and the sedation                            options and risks were discussed with the patient.                            All questions were answered, and informed consent                            was obtained. Prior Anticoagulants: The patient has                            taken no anticoagulant or antiplatelet agents. ASA                            Grade Assessment: II - A patient with mild systemic                            disease. After reviewing the risks and benefits,                            the patient was deemed in satisfactory condition to  undergo the procedure.                           After obtaining informed consent, the colonoscope                            was passed under direct vision. Throughout the                            procedure, the patient's blood pressure, pulse, and                            oxygen saturations were monitored continuously. The                            CF [id number] #[id number] was introduced  through the anus                            and advanced to the cecum, identified by                            appendiceal orifice and ileocecal valve. The                            colonoscopy was performed without difficulty. The                            patient tolerated the procedure well. The quality                            of the bowel preparation was excellent. The                            ileocecal valve, appendiceal orifice, and rectum                            were photographed. Scope In: 8:08:28 AM Scope Out: 8:30:27 AM Scope Withdrawal Time: 0 hours 20 minutes 18 seconds  Total Procedure Duration: 0 hours 21 minutes 59 seconds  Findings:                 The digital rectal exam was normal.                           Three sessile polyps were found in the cecum. The                            polyps were 2 to 5 mm in size. These polyps were                            removed with a cold snare. Resection and retrieval                            were complete.  Two sessile polyps were found in the ascending                            colon. The polyps were 4 to 7 mm in size. These                            polyps were removed with a cold snare. Resection                            and retrieval were complete.                           A 5 mm polyp was found in the transverse colon. The                            polyp was sessile. The polyp was removed with a                            cold snare. Resection and retrieval were complete.                           Four sessile polyps were found in the descending                            colon. The polyps were 4 to 5 mm in size. These                            polyps were removed with a cold snare. Resection                            and retrieval were complete.                           A 5 mm polyp was found in the sigmoid colon. The                            polyp was sessile. The polyp was  removed with a                            cold snare. Resection and retrieval were complete.                           Scattered medium-mouthed and small-mouthed                            diverticula were found in the sigmoid colon and                            descending colon.                           The retroflexed view of the distal rectum and anal  verge was normal and showed no anal or rectal                            abnormalities. Complications:            No immediate complications. Estimated Blood Loss:     Estimated blood loss: none. Impression:               - Three 2 to 5 mm polyps in the cecum, removed with                            a cold snare. Resected and retrieved.                           - Two 4 to 7 mm polyps in the ascending colon,                            removed with a cold snare. Resected and retrieved.                           - One 5 mm polyp in the transverse colon, removed                            with a cold snare. Resected and retrieved.                           - Four 4 to 5 mm polyps in the descending colon,                            removed with a cold snare. Resected and retrieved.                           - One 5 mm polyp in the sigmoid colon, removed with                            a cold snare. Resected and retrieved.                           - Mild diverticulosis in the sigmoid colon and in                            the descending colon.                           - The distal rectum and anal verge are normal on                            retroflexion view. Recommendation:           - Patient has a contact number available for                            emergencies. The signs and symptoms of potential  delayed complications were discussed with the                            patient. Return to normal activities tomorrow.                            Written discharge instructions were  provided to the                            patient.                           - Resume previous diet.                           - Continue present medications.                           - Await pathology results.                           - Repeat colonoscopy is recommended for                            surveillance. The colonoscopy date will be                            determined after pathology results from today's                            exam become available for review. Beverley Fiedler, MD 03/31/2023 8:41:12 AM This report has been signed electronically.

## 2023-03-31 NOTE — Progress Notes (Signed)
GASTROENTEROLOGY PROCEDURE H&P NOTE   Primary Care Physician: Ileana Ladd, MD (Inactive)    Reason for Procedure:   Hx of colonic polyps  Plan:    colonoscopy  Patient is appropriate for endoscopic procedure(s) in the ambulatory ([facility]) setting.  The nature of the procedure, as well as the risks, benefits, and alternatives were carefully and thoroughly reviewed with the patient. Ample time for discussion and questions allowed. The patient understood, was satisfied, and agreed to proceed.     HPI: Jermaine Walls. is a 57 y.o. male who presents for colonoscopy.  Medical history as below.  Tolerated the prep.  No recent chest pain or shortness of breath.  No abdominal pain today.  Past Medical History:  Diagnosis Date   Gout    Hypertension    treated in past. No longer on medications (02/19/2018)   Pneumonia 1990s X 1   Psoriasis    Arminda Resides, MD dermatology   Small bowel obstruction (HCC) 02/19/2018    Past Surgical History:  Procedure Laterality Date   ESOPHAGOGASTRODUODENOSCOPY (EGD) WITH ESOPHAGEAL DILATION  1976   HERNIA REPAIR     INSERTION OF MESH N/A 10/05/2015   Procedure: INSERTION OF MESH;  Surgeon: Axel Filler, MD;  Location: [facility] OR;  Service: General;  Laterality: N/A;   LASIK Bilateral 2003   UMBILICAL HERNIA REPAIR N/A 10/05/2015   Procedure: LAPAROSCOPIC UMBILICAL HERNIA REPAIR WITH MESH;  Surgeon: Axel Filler, MD;  Location: [facility] OR;  Service: General;  Laterality: N/A;    Prior to Admission medications   Medication Sig Start Date End Date Taking? Authorizing Provider  allopurinol (ZYLOPRIM) 300 MG tablet Take 1 tablet (300 mg total) by mouth daily. 02/24/23  Yes   amLODipine (NORVASC) 10 MG tablet Take 1 tablet (10 mg total) by mouth every morning. 01/19/23  Yes   aspirin EC 81 MG tablet Take 81 mg by mouth daily. Swallow whole.   Yes [provider]  valsartan (DIOVAN) 320 MG tablet Take 1 tablet (320 mg total) by mouth every  evening. 09/29/22  Yes   allopurinol (ZYLOPRIM) 300 MG tablet TAKE 2 TABLETS BY MOUTH ONCE A DAY 04/17/20 04/17/21  Ileana Ladd, MD  amLODipine (NORVASC) 10 MG tablet TAKE 1 TABLET BY MOUTH ONCE DAILY 09/17/20 09/17/21  Ileana Ladd, MD  amLODipine (NORVASC) 10 MG tablet TAKE 1 TABLET BY MOUTH ONCE DAILY 02/21/20 02/20/21  Ileana Ladd, MD  promethazine (PHENERGAN) 25 MG tablet Take 1 tablet (25 mg total) by mouth 3 (three) times daily as needed for nausea 09/29/22     triamcinolone cream (KENALOG) 0.1 % Apply a small amount 2 (two) times daily as needed for psoriasis flare until clear 01/12/23       Current Outpatient Medications  Medication Sig Dispense Refill   allopurinol (ZYLOPRIM) 300 MG tablet Take 1 tablet (300 mg total) by mouth daily. 90 tablet 3   amLODipine (NORVASC) 10 MG tablet Take 1 tablet (10 mg total) by mouth every morning. 90 tablet 3   aspirin EC 81 MG tablet Take 81 mg by mouth daily. Swallow whole.     valsartan (DIOVAN) 320 MG tablet Take 1 tablet (320 mg total) by mouth every evening. 90 tablet 1   allopurinol (ZYLOPRIM) 300 MG tablet TAKE 2 TABLETS BY MOUTH ONCE A DAY 180 tablet 2   amLODipine (NORVASC) 10 MG tablet TAKE 1 TABLET BY MOUTH ONCE DAILY 90 tablet 0   amLODipine (NORVASC) 10 MG tablet  TAKE 1 TABLET BY MOUTH ONCE DAILY 90 tablet 1   promethazine (PHENERGAN) 25 MG tablet Take 1 tablet (25 mg total) by mouth 3 (three) times daily as needed for nausea 15 tablet 0   triamcinolone cream (KENALOG) 0.1 % Apply a small amount 2 (two) times daily as needed for psoriasis flare until clear 30 g 0   Current Facility-Administered Medications  Medication Dose Route Frequency Provider Last Rate Last Admin   0.9 %  sodium chloride infusion  500 mL Intravenous Once , Carie Caddy, MD       betamethasone acetate-betamethasone sodium phosphate (CELESTONE) injection 3 mg  3 mg Intramuscular Once Gala Lewandowsky M, DPM       betamethasone acetate-betamethasone sodium phosphate  (CELESTONE) injection 3 mg  3 mg Intramuscular Once Felecia Shelling, DPM        Allergies as of 03/31/2023 - Review Complete 03/31/2023  Allergen Reaction Noted   Doxycycline hyclate Itching and Rash     Family History  Problem Relation Age of Onset   Cancer Mother        lung, smoker   Heart disease Father        7, mid to late 37s first bypass. grandfather died at 55.    Diabetes Father    Cancer Sister        lung, smoker   Colon cancer Neg Hx    Esophageal cancer Neg Hx    Liver cancer Neg Hx    Pancreatic cancer Neg Hx    Rectal cancer Neg Hx    Stomach cancer Neg Hx     Social History   Socioeconomic History   Marital status: Legally Separated    Spouse name: Not on file   Number of children: Not on file   Years of education: Not on file   Highest education level: Not on file  Occupational History   Not on file  Tobacco Use   Smoking status: Never   Smokeless tobacco: Former    Types: Snuff  Vaping Use   Vaping status: Never Used  Substance and Sexual Activity   Alcohol use: Yes    Alcohol/week: 8.0 standard drinks of alcohol    Types: 8 Cans of beer per week    Comment: occasional beer   Drug use: Never   Sexual activity: Yes    Partners: Female    Comment: monogomous relationship-no STD testing desired  Other Topics Concern   Not on file  Social History Narrative   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, now in operating room day shift   Hobbies: fish, hunt ,bike, time with 63 year old daughter   Single with long term girlfriend, lives alone but spends most of the time with girlfriend   Social Determinants of Corporate investment banker Strain: Not on file  Food Insecurity: Not on file  Transportation Needs: Not on file  Physical Activity: Not on file  Stress: Not on file  Social Connections: Not on file  Intimate Partner Violence: Not on file    Physical Exam: Vital signs in last 24 hours: @BP  (!) 123/58   Pulse 64   Temp 98.1 F (36.7 C)  (Temporal)   Ht 5\' 10"  (1.778 m)   Wt 250 lb (113.4 kg)   SpO2 97%   BMI 35.87 kg/m  GEN: NAD EYE: Sclerae anicteric ENT: MMM CV: Non-tachycardic Pulm: CTA b/l GI: Soft, NT/ND NEURO:  Alert & Oriented x 3   Erick Blinks,  MD [facility]  03/31/2023 8:02 AM

## 2023-04-04 ENCOUNTER — Telehealth: Payer: Self-pay | Admitting: *Deleted

## 2023-04-04 NOTE — Telephone Encounter (Signed)
Attempted f/u phone call. No answer. Left message. °

## 2023-04-07 ENCOUNTER — Encounter: Payer: Self-pay | Admitting: Internal Medicine

## 2023-04-13 ENCOUNTER — Other Ambulatory Visit (HOSPITAL_COMMUNITY): Payer: Self-pay

## 2023-04-13 MED ORDER — VALSARTAN 320 MG PO TABS
320.0000 mg | ORAL_TABLET | Freq: Every evening | ORAL | 2 refills | Status: AC
  Filled 2023-04-13: qty 90, 90d supply, fill #0
  Filled 2023-07-26: qty 90, 90d supply, fill #1
  Filled 2023-10-25: qty 90, 90d supply, fill #2

## 2023-05-10 ENCOUNTER — Telehealth: Payer: Self-pay | Admitting: Internal Medicine

## 2023-05-10 NOTE — Telephone Encounter (Signed)
8/09, patient is calling in regards to his procedure, patient states the procedure was coded incorrectly and would like to speak to someone to get the problem resolved. Please advise.

## 2023-05-11 ENCOUNTER — Other Ambulatory Visit: Payer: Self-pay

## 2023-05-11 ENCOUNTER — Other Ambulatory Visit (HOSPITAL_COMMUNITY): Payer: Self-pay

## 2023-06-01 ENCOUNTER — Other Ambulatory Visit (HOSPITAL_COMMUNITY): Payer: Self-pay

## 2023-06-01 ENCOUNTER — Other Ambulatory Visit: Payer: Self-pay

## 2023-06-01 MED ORDER — PREDNISONE 20 MG PO TABS
40.0000 mg | ORAL_TABLET | Freq: Every day | ORAL | 0 refills | Status: DC
  Filled 2023-06-01 (×2): qty 10, 5d supply, fill #0

## 2023-06-29 ENCOUNTER — Other Ambulatory Visit (HOSPITAL_COMMUNITY): Payer: Self-pay

## 2023-06-29 DIAGNOSIS — M7661 Achilles tendinitis, right leg: Secondary | ICD-10-CM | POA: Diagnosis not present

## 2023-06-29 MED ORDER — MELOXICAM 15 MG PO TABS
15.0000 mg | ORAL_TABLET | Freq: Every day | ORAL | 0 refills | Status: DC
  Filled 2023-06-29: qty 5, 5d supply, fill #0

## 2023-07-06 DIAGNOSIS — M7661 Achilles tendinitis, right leg: Secondary | ICD-10-CM | POA: Diagnosis not present

## 2023-07-13 DIAGNOSIS — Z8739 Personal history of other diseases of the musculoskeletal system and connective tissue: Secondary | ICD-10-CM | POA: Diagnosis not present

## 2023-07-13 DIAGNOSIS — R739 Hyperglycemia, unspecified: Secondary | ICD-10-CM | POA: Diagnosis not present

## 2023-07-13 DIAGNOSIS — I10 Essential (primary) hypertension: Secondary | ICD-10-CM | POA: Diagnosis not present

## 2023-07-13 DIAGNOSIS — Z6838 Body mass index (BMI) 38.0-38.9, adult: Secondary | ICD-10-CM | POA: Diagnosis not present

## 2023-08-11 ENCOUNTER — Other Ambulatory Visit (HOSPITAL_COMMUNITY): Payer: Self-pay

## 2023-08-11 MED ORDER — AMOXICILLIN-POT CLAVULANATE 875-125 MG PO TABS
1.0000 | ORAL_TABLET | Freq: Two times a day (BID) | ORAL | 0 refills | Status: DC
  Filled 2023-08-11: qty 14, 7d supply, fill #0

## 2023-08-31 ENCOUNTER — Other Ambulatory Visit (HOSPITAL_COMMUNITY): Payer: Self-pay

## 2023-09-01 ENCOUNTER — Ambulatory Visit: Payer: 59 | Admitting: Podiatry

## 2023-09-22 DIAGNOSIS — M25562 Pain in left knee: Secondary | ICD-10-CM | POA: Diagnosis not present

## 2023-09-29 ENCOUNTER — Other Ambulatory Visit (HOSPITAL_COMMUNITY): Payer: Self-pay

## 2023-09-29 ENCOUNTER — Encounter (HOSPITAL_COMMUNITY): Payer: Self-pay

## 2023-09-29 MED ORDER — ZEPBOUND 2.5 MG/0.5ML ~~LOC~~ SOAJ
2.5000 mg | SUBCUTANEOUS | 0 refills | Status: DC
Start: 2023-09-29 — End: 2023-09-29
  Filled 2023-09-29 (×2): qty 2, 28d supply, fill #0

## 2023-10-02 ENCOUNTER — Other Ambulatory Visit (HOSPITAL_COMMUNITY): Payer: Self-pay

## 2023-10-26 ENCOUNTER — Other Ambulatory Visit (HOSPITAL_COMMUNITY): Payer: Self-pay

## 2023-10-30 ENCOUNTER — Other Ambulatory Visit (HOSPITAL_COMMUNITY): Payer: Self-pay

## 2023-11-23 ENCOUNTER — Encounter: Payer: Self-pay | Admitting: Orthopedic Surgery

## 2023-11-23 ENCOUNTER — Other Ambulatory Visit (INDEPENDENT_AMBULATORY_CARE_PROVIDER_SITE_OTHER): Payer: Self-pay

## 2023-11-23 ENCOUNTER — Ambulatory Visit: Admitting: Orthopedic Surgery

## 2023-11-23 DIAGNOSIS — M25562 Pain in left knee: Secondary | ICD-10-CM

## 2023-11-23 DIAGNOSIS — M25462 Effusion, left knee: Secondary | ICD-10-CM | POA: Diagnosis not present

## 2023-11-24 ENCOUNTER — Ambulatory Visit
Admission: RE | Admit: 2023-11-24 | Discharge: 2023-11-24 | Disposition: A | Discharge status: Home / Self Care | Source: Ambulatory Visit | Attending: Orthopedic Surgery | Admitting: Orthopedic Surgery

## 2023-11-24 ENCOUNTER — Encounter: Payer: Self-pay | Admitting: Orthopedic Surgery

## 2023-11-24 DIAGNOSIS — M25462 Effusion, left knee: Secondary | ICD-10-CM | POA: Diagnosis not present

## 2023-11-24 DIAGNOSIS — M25562 Pain in left knee: Secondary | ICD-10-CM

## 2023-11-24 DIAGNOSIS — M23322 Other meniscus derangements, posterior horn of medial meniscus, left knee: Secondary | ICD-10-CM | POA: Diagnosis not present

## 2023-11-24 DIAGNOSIS — M7122 Synovial cyst of popliteal space [Baker], left knee: Secondary | ICD-10-CM | POA: Diagnosis not present

## 2023-11-24 MED ORDER — BUPIVACAINE HCL 0.25 % IJ SOLN
4.0000 mL | INTRAMUSCULAR | Status: AC | PRN
Start: 2023-11-23 — End: 2023-11-23
  Administered 2023-11-23: 4 mL via INTRA_ARTICULAR

## 2023-11-24 MED ORDER — LIDOCAINE HCL 1 % IJ SOLN
5.0000 mL | INTRAMUSCULAR | Status: AC | PRN
Start: 2023-11-23 — End: 2023-11-23
  Administered 2023-11-23: 5 mL

## 2023-11-24 MED ORDER — METHYLPREDNISOLONE ACETATE 40 MG/ML IJ SUSP
40.0000 mg | INTRAMUSCULAR | Status: AC | PRN
  Administered 2023-11-23: 40 mg via INTRA_ARTICULAR

## 2023-11-24 NOTE — Progress Notes (Signed)
 Office Visit Note   Patient: Jermaine Walls.           Date of Birth: [birth year]           MRN: [id number] Visit Date: 11/23/2023 Requested by: No referring provider defined for this encounter. PCP: Ileana Ladd, MD (Inactive)  Subjective: Chief Complaint  Patient presents with   Knee Pain    HPI: Marbin Olshefski. is a 58 y.o. male who presents to the office reporting left knee pain.  Denies any history of injury.  Symptoms ongoing for 6 weeks.  Reports a lot of pain in the retropatellar region and his knee is essentially locked when he is walking.  Unable to fully extend when he is walking.  Does report swelling and symptoms of instability and giving way.  No prior left knee surgery.  Has taken ibuprofen without much relief.  Has used ice and Voltaren cream with only minimal relief.  Does have a history of gout but that usually affects his feet and patient describes that this does not feel like a gout attack in the knee. I did see Chip about 6 weeks ago at [facility] and instructed him in a physician directed rehabilitation program consisting of quad and hamstring strengthening and stationary bike.  He has tried this in this physical therapy has not been effective in relieving his pain.  ROS: All systems reviewed are negative as they relate to the chief complaint within the history of present illness.  Patient denies fevers or chills.  Assessment & Plan: Visit Diagnoses:  1. Left knee pain, unspecified chronicity     Plan: Impression is left knee effusion and a locked knee with inability to fully extend the knee.  Been going on for 6 weeks.  Effusion present.  No arthritis on plain radiographs.  Meniscal pathology likely.  Chondral defect also possible.  Aspiration and injection of the knee performed today.  No definite meniscal fragments present in the clear straw-colored fluid aspirated which was about 18 cc.  MRI scan indicated because of failure of conservative  management including medication activity modification as well as therapeutic exercises and physical therapy.  We will see him back after that study.  Follow-Up Instructions: No follow-ups on file.   Orders:  Orders Placed This Encounter  Procedures   XR KNEE 3 VIEW LEFT   MR Knee Left w/o contrast   No orders of the defined types were placed in this encounter.     Procedures: Large Joint Inj: L knee on 11/23/2023 6:40 AM Indications: diagnostic evaluation, joint swelling and pain Details: 18 G 1.5 in needle, superolateral approach  Arthrogram: No  Medications: 5 mL lidocaine 1 %; 40 mg methylPREDNISolone acetate 40 MG/ML; 4 mL bupivacaine 0.25 % Outcome: tolerated well, no immediate complications Procedure, treatment alternatives, risks and benefits explained, specific risks discussed. Consent was given by the patient. Immediately prior to procedure a time out was called to verify the correct patient, procedure, equipment, support staff and site/side marked as required. Patient was prepped and draped in the usual sterile fashion.       Clinical Data: No additional findings.  Objective: Vital Signs: There were no vitals taken for this visit.  Physical Exam:  Constitutional: Patient appears well-developed HEENT:  Head: Normocephalic Eyes:EOM are normal Neck: Normal range of motion Cardiovascular: Normal rate Pulmonary/chest: Effort normal Neurologic: Patient is alert Skin: Skin is warm Psychiatric: Patient has normal mood and affect  Ortho Exam: Ortho exam  demonstrates antalgic gait to the left.  Knee lacks about 10 degrees of full extension on the left compared to the right.  Collateral cruciate ligaments are stable on the left-hand side.  Pedal pulses palpable.  No groin pain with internal/external Tatian of the leg.  No Baker's cyst palpable left knee versus right knee.  No calf tenderness to direct palpation on the medial side.  Slight tenderness on the lateral side but  negative Homans.  Flexion range of motion is full on the left.  Extensor mechanism intact.  Does have a little bit more lateral than medial joint line tenderness particularly anterior.  Specialty Comments:  No specialty comments available.  Imaging: XR KNEE 3 VIEW LEFT Result Date: 11/24/2023 AP lateral merchant radiographs left knee reviewed.  Alignment intact.  No acute fracture.  Minimal joint space narrowing on the medial side with no joint space narrowing on the lateral side or patellofemoral compartment.  No spurring present in any of the 3 compartments.    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 No longer on medications (02/19/2018)   Pneumonia 1990s X 1   Psoriasis    Arminda Resides, MD dermatology   Small bowel obstruction [facility]) 02/19/2018    Family History  Problem Relation Age of Onset   Cancer Mother        lung, smoker   Heart disease Father        35, mid to late 8s first bypass. grandfather died at 43.    Diabetes Father    Cancer Sister        lung, smoker   Colon cancer Neg Hx    Esophageal cancer Neg Hx    Liver cancer Neg Hx    Pancreatic cancer Neg Hx    Rectal cancer Neg Hx     Stomach cancer Neg Hx     Past Surgical History:  Procedure Laterality Date   ESOPHAGOGASTRODUODENOSCOPY (EGD) WITH ESOPHAGEAL DILATION  1976   HERNIA REPAIR     INSERTION OF MESH N/A 10/05/2015   Procedure: INSERTION OF MESH;  Surgeon: Axel Filler, MD;  Location: [facility] OR;  Service: General;  Laterality: N/A;   LASIK Bilateral 2003   UMBILICAL HERNIA REPAIR N/A 10/05/2015   Procedure: LAPAROSCOPIC UMBILICAL HERNIA REPAIR WITH MESH;  Surgeon: Axel Filler, MD;  Location: [facility] OR;  Service: General;  Laterality: N/A;   Social History   Occupational History   Not on file  Tobacco Use   Smoking status: Never   Smokeless tobacco: Former    Types: Snuff  Vaping Use   Vaping status: Never Used  Substance and Sexual Activity   Alcohol use: Yes    Alcohol/week: 8.0 standard drinks of alcohol  Types: 8 Cans of beer per week    Comment: occasional beer   Drug use: Never   Sexual activity: Yes    Partners: Female    Comment: monogomous relationship-no STD testing desired

## 2023-11-27 NOTE — Progress Notes (Signed)
 I called Jermaine Walls.  Has medial meniscal tear which may require debridement but also may require repair.  He is going on a cruise in June with his daughter.  Would like to get injection about a week beforehand.  He will call to set that up.  Likely have surgery shortly thereafter.

## 2023-12-15 ENCOUNTER — Telehealth: Payer: Self-pay | Admitting: Orthopedic Surgery

## 2023-12-21 ENCOUNTER — Telehealth: Payer: Self-pay | Admitting: Orthopedic Surgery

## 2023-12-21 NOTE — Telephone Encounter (Signed)
 Pt called requesting to speak with Stephenie Einstein or someone who can schedule his surgery. Pt states he has left several message with no return call.   Pt's call back number [phone]

## 2023-12-22 NOTE — Telephone Encounter (Signed)
 Jermaine Walls has scheduled patient per post op appt in chart.

## 2024-01-18 ENCOUNTER — Other Ambulatory Visit (HOSPITAL_COMMUNITY): Payer: Self-pay

## 2024-01-18 DIAGNOSIS — L814 Other melanin hyperpigmentation: Secondary | ICD-10-CM | POA: Diagnosis not present

## 2024-01-18 DIAGNOSIS — D225 Melanocytic nevi of trunk: Secondary | ICD-10-CM | POA: Diagnosis not present

## 2024-01-18 DIAGNOSIS — D2372 Other benign neoplasm of skin of left lower limb, including hip: Secondary | ICD-10-CM | POA: Diagnosis not present

## 2024-01-18 DIAGNOSIS — L723 Sebaceous cyst: Secondary | ICD-10-CM | POA: Diagnosis not present

## 2024-01-18 MED ORDER — AMLODIPINE BESYLATE 10 MG PO TABS
10.0000 mg | ORAL_TABLET | Freq: Every morning | ORAL | 0 refills | Status: AC
  Filled 2024-01-18: qty 90, 90d supply, fill #0

## 2024-01-26 ENCOUNTER — Ambulatory Visit: Admitting: Orthopedic Surgery

## 2024-01-29 ENCOUNTER — Other Ambulatory Visit (HOSPITAL_COMMUNITY): Payer: Self-pay

## 2024-01-29 MED ORDER — VALSARTAN 320 MG PO TABS
320.0000 mg | ORAL_TABLET | Freq: Every evening | ORAL | 0 refills | Status: DC
  Filled 2024-01-29: qty 90, 90d supply, fill #0

## 2024-02-08 ENCOUNTER — Telehealth: Payer: Self-pay

## 2024-02-08 NOTE — Telephone Encounter (Signed)
 Patient would like to know if you have received any FMLA forms?   CB [id number]

## 2024-02-09 NOTE — Telephone Encounter (Signed)
 IC,lmvm advised [employer] form received. Need to come in to complete auth and pay $20 form fee.

## 2024-02-15 DIAGNOSIS — Z23 Encounter for immunization: Secondary | ICD-10-CM | POA: Diagnosis not present

## 2024-02-15 DIAGNOSIS — Z125 Encounter for screening for malignant neoplasm of prostate: Secondary | ICD-10-CM | POA: Diagnosis not present

## 2024-02-15 DIAGNOSIS — M25569 Pain in unspecified knee: Secondary | ICD-10-CM | POA: Diagnosis not present

## 2024-02-15 DIAGNOSIS — I10 Essential (primary) hypertension: Secondary | ICD-10-CM | POA: Diagnosis not present

## 2024-02-15 DIAGNOSIS — Z8739 Personal history of other diseases of the musculoskeletal system and connective tissue: Secondary | ICD-10-CM | POA: Diagnosis not present

## 2024-02-15 DIAGNOSIS — Z01818 Encounter for other preprocedural examination: Secondary | ICD-10-CM | POA: Diagnosis not present

## 2024-02-15 DIAGNOSIS — Z0000 Encounter for general adult medical examination without abnormal findings: Secondary | ICD-10-CM | POA: Diagnosis not present

## 2024-02-21 NOTE — Pre-Procedure Instructions (Signed)
 Surgical Instructions   Your procedure is scheduled on February 29, 2024. Report to [facility] Main Entrance A at 5:30 A.M., then check in with the Admitting office. Any questions or running late day of surgery: call [phone]  Questions prior to your surgery date: call [phone], Monday-Friday, 8am-4pm. If you experience any cold or flu symptoms such as cough, fever, chills, shortness of breath, etc. between now and your scheduled surgery, please notify us  at the above number.     Remember:  Do not eat after midnight the night before your surgery   You may drink clear liquids until 4:30 AM the morning of your surgery.   Clear liquids allowed are: Water, Non-Citrus Juices (without pulp), Carbonated Beverages, Clear Tea (no milk, honey, etc.), Black Coffee Only (NO MILK, CREAM OR POWDERED CREAMER of any kind), and Gatorade.  Patient Instructions  The night before surgery:  No food after midnight. ONLY clear liquids after midnight  The day of surgery (if you do NOT have diabetes):  Drink ONE (1) Pre-Surgery Clear Ensure by 4:30 AM the morning of surgery. Drink in one sitting. Do not sip.  This drink was given to you during your hospital  pre-op appointment visit.  Nothing else to drink after completing the  Pre-Surgery Clear Ensure.         If you have questions, please contact your surgeon's office.    Take these medicines the morning of surgery with A SIP OF WATER: allopurinol  (ZYLOPRIM )  amLODipine  (NORVASC )    Follow your surgeon's instructions on when to stop Aspirin.  If no instructions were given by your surgeon then you will need to call the office to get those instructions.     One week prior to surgery, STOP taking any Aleve, Naproxen, Ibuprofen, Motrin, Advil, Goody's, BC's, all herbal medications, fish oil, and non-prescription vitamins.                     Do NOT Smoke (Tobacco/Vaping) for 24 hours prior to your procedure.  If you use a CPAP at night, you  may bring your mask/headgear for your overnight stay.   You will be asked to remove any contacts, glasses, piercing's, hearing aid's, dentures/partials prior to surgery. Please bring cases for these items if needed.    Patients discharged the day of surgery will not be allowed to drive home, and someone needs to stay with them for 24 hours.  SURGICAL WAITING ROOM VISITATION Patients may have no more than 2 support people in the waiting area - these visitors may rotate.   Pre-op nurse will coordinate an appropriate time for 1 ADULT support person, who may not rotate, to accompany patient in pre-op.  Children under the age of 64 must have an adult with them who is not the patient and must remain in the main waiting area with an adult.  If the patient needs to stay at the hospital during part of their recovery, the visitor guidelines for inpatient rooms apply.  Please refer to the [facility] website for the visitor guidelines for any additional information.   If you received a COVID test during your pre-op visit  it is requested that you wear a mask when out in public, stay away from anyone that may not be feeling well and notify your surgeon if you develop symptoms. If you have been in contact with anyone that has tested positive in the last 10 days please notify you surgeon.  Pre-operative CHG Bathing Instructions   You can play a key role in reducing the risk of infection after surgery. Your skin needs to be as free of germs as possible. You can reduce the number of germs on your skin by washing with CHG (chlorhexidine  gluconate) soap before surgery. CHG is an antiseptic soap that kills germs and continues to kill germs even after washing.   DO NOT use if you have an allergy to chlorhexidine /CHG or antibacterial soaps. If your skin becomes reddened or irritated, stop using the CHG and notify one of our RNs at [phone].              TAKE A SHOWER THE NIGHT BEFORE SURGERY AND THE  DAY OF SURGERY    Please keep in mind the following:  DO NOT shave, including legs and underarms, 48 hours prior to surgery.   You may shave your face before/day of surgery.  Place clean sheets on your bed the night before surgery Use a clean washcloth (not used since being washed) for each shower. DO NOT sleep with pet's night before surgery.  CHG Shower Instructions:  Wash your face and private area with normal soap. If you choose to wash your hair, wash first with your normal shampoo.  After you use shampoo/soap, rinse your hair and body thoroughly to remove shampoo/soap residue.  Turn the water OFF and apply half the bottle of CHG soap to a CLEAN washcloth.  Apply CHG soap ONLY FROM YOUR NECK DOWN TO YOUR TOES (washing for 3-5 minutes)  DO NOT use CHG soap on face, private areas, open wounds, or sores.  Pay special attention to the area where your surgery is being performed.  If you are having back surgery, having someone wash your back for you may be helpful. Wait 2 minutes after CHG soap is applied, then you may rinse off the CHG soap.  Pat dry with a clean towel  Put on clean pajamas    Additional instructions for the day of surgery: DO NOT APPLY any lotions, deodorants, cologne, or perfumes.   Do not wear jewelry or makeup Do not wear nail polish, gel polish, artificial nails, or any other type of covering on natural nails (fingers and toes) Do not bring valuables to the hospital. [facility] is not responsible for valuables/personal belongings. Put on clean/comfortable clothes.  Please brush your teeth.  Ask your nurse before applying any prescription medications to the skin.

## 2024-02-22 ENCOUNTER — Other Ambulatory Visit: Payer: Self-pay

## 2024-02-22 ENCOUNTER — Encounter (HOSPITAL_COMMUNITY)
Admission: RE | Admit: 2024-02-22 | Discharge: 2024-02-22 | Disposition: A | Discharge status: Home / Self Care | Source: Ambulatory Visit | Attending: Orthopedic Surgery | Admitting: Orthopedic Surgery

## 2024-02-22 ENCOUNTER — Other Ambulatory Visit (HOSPITAL_COMMUNITY): Payer: Self-pay

## 2024-02-22 ENCOUNTER — Encounter (HOSPITAL_COMMUNITY): Payer: Self-pay

## 2024-02-22 DIAGNOSIS — Z01818 Encounter for other preprocedural examination: Secondary | ICD-10-CM | POA: Insufficient documentation

## 2024-02-22 HISTORY — DX: Ischemic optic neuropathy, right eye: H47.011

## 2024-02-22 MED ORDER — ALLOPURINOL 300 MG PO TABS
300.0000 mg | ORAL_TABLET | Freq: Every day | ORAL | 3 refills | Status: AC
  Filled 2024-02-22: qty 90, 90d supply, fill #0
  Filled 2024-05-24: qty 90, 90d supply, fill #1
  Filled 2024-08-09: qty 90, 90d supply, fill #2

## 2024-02-22 NOTE — Progress Notes (Signed)
 PCP - Bernardino Dayna HAS Cardiologist - denies  PPM/ICD - denies Device Orders -  Rep Notified -   Chest x-ray - na EKG - 02/22/24 Stress Test - denies ECHO - denies Cardiac Cath - denies  Sleep Study - denies CPAP -   Fasting Blood Sugar - na Checks Blood Sugar _____ times a day  Last dose of GLP1 agonist-  na GLP1 instructions:   Blood Thinner Instructions:na Aspirin Instructions:PCP instructed pt to stop Aspirin one week prior to surgery. Last dose was 7/3 am.  ERAS Protcol - clear liquids until 0430 PRE-SURGERY Ensure or G2- Ensure  COVID TEST- na   Anesthesia review: no  Patient denies shortness of breath, fever, cough and chest pain at PAT appointment   All instructions explained to the patient, with a verbal understanding of the material. Patient agrees to go over the instructions while at home for a better understanding.  The opportunity to ask questions was provided.

## 2024-02-29 ENCOUNTER — Ambulatory Visit (HOSPITAL_COMMUNITY)
Admission: RE | Admit: 2024-02-29 | Discharge: 2024-02-29 | Disposition: A | Discharge status: Home / Self Care | Source: Ambulatory Visit | Attending: Orthopedic Surgery | Admitting: Orthopedic Surgery

## 2024-02-29 ENCOUNTER — Other Ambulatory Visit: Payer: Self-pay

## 2024-02-29 ENCOUNTER — Ambulatory Visit (HOSPITAL_COMMUNITY): Admitting: Anesthesiology

## 2024-02-29 ENCOUNTER — Encounter (HOSPITAL_COMMUNITY): Payer: Self-pay | Admitting: Orthopedic Surgery

## 2024-02-29 ENCOUNTER — Other Ambulatory Visit (HOSPITAL_COMMUNITY): Payer: Self-pay

## 2024-02-29 ENCOUNTER — Encounter (HOSPITAL_COMMUNITY)
Admission: RE | Disposition: A | Discharge status: Home / Self Care | Payer: Self-pay | Source: Ambulatory Visit | Attending: Orthopedic Surgery

## 2024-02-29 DIAGNOSIS — Z6837 Body mass index (BMI) 37.0-37.9, adult: Secondary | ICD-10-CM | POA: Insufficient documentation

## 2024-02-29 DIAGNOSIS — X58XXXA Exposure to other specified factors, initial encounter: Secondary | ICD-10-CM | POA: Diagnosis not present

## 2024-02-29 DIAGNOSIS — S83242A Other tear of medial meniscus, current injury, left knee, initial encounter: Secondary | ICD-10-CM

## 2024-02-29 DIAGNOSIS — I10 Essential (primary) hypertension: Secondary | ICD-10-CM | POA: Insufficient documentation

## 2024-02-29 DIAGNOSIS — E66813 Obesity, class 3: Secondary | ICD-10-CM | POA: Insufficient documentation

## 2024-02-29 DIAGNOSIS — S83242S Other tear of medial meniscus, current injury, left knee, sequela: Secondary | ICD-10-CM

## 2024-02-29 DIAGNOSIS — E7800 Pure hypercholesterolemia, unspecified: Secondary | ICD-10-CM

## 2024-02-29 DIAGNOSIS — S83282A Other tear of lateral meniscus, current injury, left knee, initial encounter: Secondary | ICD-10-CM

## 2024-02-29 DIAGNOSIS — Z01818 Encounter for other preprocedural examination: Secondary | ICD-10-CM

## 2024-02-29 DIAGNOSIS — M109 Gout, unspecified: Secondary | ICD-10-CM | POA: Insufficient documentation

## 2024-02-29 DIAGNOSIS — Z79899 Other long term (current) drug therapy: Secondary | ICD-10-CM | POA: Diagnosis not present

## 2024-02-29 SURGERY — ARTHROSCOPY, KNEE, WITH MENISCUS REPAIR
Anesthesia: General | Site: Knee | Laterality: Left

## 2024-02-29 MED ORDER — OXYCODONE HCL 5 MG PO TABS
ORAL_TABLET | ORAL | Status: AC
  Filled 2024-02-29: qty 1

## 2024-02-29 MED ORDER — SODIUM CHLORIDE 0.9 % IR SOLN
Status: DC | PRN
  Administered 2024-02-29 (×3): 3000 mL

## 2024-02-29 MED ORDER — POVIDONE-IODINE 10 % EX SWAB
2.0000 | Freq: Once | CUTANEOUS | Status: AC
  Administered 2024-02-29: 2 via TOPICAL

## 2024-02-29 MED ORDER — MIDAZOLAM HCL 2 MG/2ML IJ SOLN
INTRAMUSCULAR | Status: AC
Start: 2024-02-29 — End: 2024-02-29
  Filled 2024-02-29: qty 2

## 2024-02-29 MED ORDER — MORPHINE SULFATE 4 MG/ML IJ SOLN
INTRAMUSCULAR | Status: DC | PRN
  Administered 2024-02-29: 23 mL via INTRA_ARTICULAR

## 2024-02-29 MED ORDER — LACTATED RINGERS IV SOLN: INTRAVENOUS | Status: DC

## 2024-02-29 MED ORDER — HYDROMORPHONE HCL 1 MG/ML IJ SOLN
INTRAMUSCULAR | Status: AC
  Filled 2024-02-29: qty 1

## 2024-02-29 MED ORDER — KETOROLAC TROMETHAMINE 30 MG/ML IJ SOLN
INTRAMUSCULAR | Status: DC | PRN
Start: 2024-02-29 — End: 2024-02-29
  Administered 2024-02-29: 30 mg via INTRAVENOUS

## 2024-02-29 MED ORDER — PROPOFOL 10 MG/ML IV BOLUS
INTRAVENOUS | Status: AC
  Filled 2024-02-29: qty 20

## 2024-02-29 MED ORDER — BUPIVACAINE-EPINEPHRINE (PF) 0.5% -1:200000 IJ SOLN
INTRAMUSCULAR | Status: AC
Start: 2024-02-29 — End: 2024-02-29
  Filled 2024-02-29: qty 30

## 2024-02-29 MED ORDER — ORAL CARE MOUTH RINSE: 15.0000 mL | Freq: Once | OROMUCOSAL | Status: AC

## 2024-02-29 MED ORDER — DEXAMETHASONE SODIUM PHOSPHATE 10 MG/ML IJ SOLN
INTRAMUSCULAR | Status: AC
  Filled 2024-02-29: qty 1

## 2024-02-29 MED ORDER — DEXAMETHASONE SODIUM PHOSPHATE 10 MG/ML IJ SOLN
INTRAMUSCULAR | Status: DC | PRN
  Administered 2024-02-29: 10 mg via INTRAVENOUS

## 2024-02-29 MED ORDER — OXYCODONE HCL 5 MG PO TABS
5.0000 mg | ORAL_TABLET | Freq: Once | ORAL | Status: AC
  Administered 2024-02-29: 5 mg via ORAL

## 2024-02-29 MED ORDER — PHENYLEPHRINE 80 MCG/ML (10ML) SYRINGE FOR IV PUSH (FOR BLOOD PRESSURE SUPPORT)
PREFILLED_SYRINGE | INTRAVENOUS | Status: DC | PRN
  Administered 2024-02-29 (×3): 80 ug via INTRAVENOUS

## 2024-02-29 MED ORDER — POVIDONE-IODINE 7.5 % EX SOLN: Freq: Once | CUTANEOUS | Status: DC

## 2024-02-29 MED ORDER — CEFAZOLIN SODIUM-DEXTROSE 2-4 GM/100ML-% IV SOLN
2.0000 g | INTRAVENOUS | Status: AC
  Administered 2024-02-29: 2 g via INTRAVENOUS
  Filled 2024-02-29: qty 100

## 2024-02-29 MED ORDER — EPINEPHRINE PF 1 MG/ML IJ SOLN
INTRAMUSCULAR | Status: AC
  Filled 2024-02-29: qty 2

## 2024-02-29 MED ORDER — LIDOCAINE 2% (20 MG/ML) 5 ML SYRINGE
INTRAMUSCULAR | Status: DC | PRN
  Administered 2024-02-29: 60 mg via INTRAVENOUS

## 2024-02-29 MED ORDER — SODIUM CHLORIDE (PF) 0.9 % IJ SOLN
INTRAMUSCULAR | Status: DC | PRN
  Administered 2024-02-29 (×2): 3000 mL

## 2024-02-29 MED ORDER — BUPIVACAINE HCL (PF) 0.25 % IJ SOLN
INTRAMUSCULAR | Status: AC
  Filled 2024-02-29: qty 30

## 2024-02-29 MED ORDER — CHLORHEXIDINE GLUCONATE 0.12 % MT SOLN
15.0000 mL | Freq: Once | OROMUCOSAL | Status: AC
  Administered 2024-02-29: 15 mL via OROMUCOSAL
  Filled 2024-02-29: qty 15

## 2024-02-29 MED ORDER — FENTANYL CITRATE (PF) 250 MCG/5ML IJ SOLN
INTRAMUSCULAR | Status: AC
  Filled 2024-02-29: qty 5

## 2024-02-29 MED ORDER — CLONIDINE HCL (ANALGESIA) 100 MCG/ML EP SOLN
EPIDURAL | Status: AC
Start: 2024-02-29 — End: 2024-02-29
  Filled 2024-02-29: qty 10

## 2024-02-29 MED ORDER — PROPOFOL 10 MG/ML IV BOLUS
INTRAVENOUS | Status: DC | PRN
  Administered 2024-02-29: 200 mg via INTRAVENOUS

## 2024-02-29 MED ORDER — MIDAZOLAM HCL 2 MG/2ML IJ SOLN
INTRAMUSCULAR | Status: DC | PRN
  Administered 2024-02-29 (×2): 2 mg via INTRAVENOUS

## 2024-02-29 MED ORDER — ONDANSETRON HCL 4 MG/2ML IJ SOLN
INTRAMUSCULAR | Status: DC | PRN
  Administered 2024-02-29: 4 mg via INTRAVENOUS

## 2024-02-29 MED ORDER — OXYCODONE HCL 5 MG PO TABS
5.0000 mg | ORAL_TABLET | ORAL | 0 refills | Status: AC | PRN
  Filled 2024-02-29: qty 30, 5d supply, fill #0

## 2024-02-29 MED ORDER — FENTANYL CITRATE (PF) 250 MCG/5ML IJ SOLN
INTRAMUSCULAR | Status: DC | PRN
  Administered 2024-02-29: 25 ug via INTRAVENOUS
  Administered 2024-02-29: 50 ug via INTRAVENOUS
  Administered 2024-02-29 (×3): 25 ug via INTRAVENOUS
  Administered 2024-02-29: 50 ug via INTRAVENOUS

## 2024-02-29 MED ORDER — ACETAMINOPHEN 500 MG PO TABS
1000.0000 mg | ORAL_TABLET | Freq: Once | ORAL | Status: AC
  Administered 2024-02-29: 1000 mg via ORAL
  Filled 2024-02-29: qty 2

## 2024-02-29 MED ORDER — ONDANSETRON HCL 4 MG/2ML IJ SOLN
INTRAMUSCULAR | Status: AC
  Filled 2024-02-29: qty 2

## 2024-02-29 MED ORDER — LIDOCAINE 2% (20 MG/ML) 5 ML SYRINGE
INTRAMUSCULAR | Status: AC
  Filled 2024-02-29: qty 5

## 2024-02-29 MED ORDER — PHENYLEPHRINE 80 MCG/ML (10ML) SYRINGE FOR IV PUSH (FOR BLOOD PRESSURE SUPPORT)
PREFILLED_SYRINGE | INTRAVENOUS | Status: AC
  Filled 2024-02-29: qty 10

## 2024-02-29 MED ORDER — METHOCARBAMOL 500 MG PO TABS
500.0000 mg | ORAL_TABLET | Freq: Three times a day (TID) | ORAL | 0 refills | Status: AC | PRN
  Filled 2024-02-29: qty 30, 10d supply, fill #0

## 2024-02-29 MED ORDER — MIDAZOLAM HCL 2 MG/2ML IJ SOLN
INTRAMUSCULAR | Status: AC
  Filled 2024-02-29: qty 2

## 2024-02-29 MED ORDER — MORPHINE SULFATE (PF) 4 MG/ML IV SOLN
INTRAVENOUS | Status: AC
  Filled 2024-02-29: qty 2

## 2024-02-29 MED ORDER — EPHEDRINE SULFATE-NACL 50-0.9 MG/10ML-% IV SOSY
PREFILLED_SYRINGE | INTRAVENOUS | Status: DC | PRN
  Administered 2024-02-29: 10 mg via INTRAVENOUS

## 2024-02-29 MED ORDER — KETOROLAC TROMETHAMINE 30 MG/ML IJ SOLN
INTRAMUSCULAR | Status: AC
  Filled 2024-02-29: qty 1

## 2024-02-29 MED ORDER — 0.9 % SODIUM CHLORIDE (POUR BTL) OPTIME
TOPICAL | Status: DC | PRN
  Administered 2024-02-29: 1000 mL

## 2024-02-29 MED ORDER — EPHEDRINE 5 MG/ML INJ
INTRAVENOUS | Status: AC
  Filled 2024-02-29: qty 5

## 2024-02-29 MED ORDER — HYDROMORPHONE HCL 1 MG/ML IJ SOLN
0.2500 mg | INTRAMUSCULAR | Status: DC | PRN
  Administered 2024-02-29 (×4): 0.5 mg via INTRAVENOUS

## 2024-02-29 SURGICAL SUPPLY — 63 items
ALCOHOL 70% 16 OZ (MISCELLANEOUS) ×1 IMPLANT
BAG COUNTER SPONGE SURGICOUNT (BAG) ×1 IMPLANT
BANDAGE ESMARK 6X9 LF (GAUZE/BANDAGES/DRESSINGS) IMPLANT
BLADE CLIPPER SURG (BLADE) IMPLANT
BLADE EXCALIBUR 4.0X13 (MISCELLANEOUS) ×1 IMPLANT
BLADE SURG 10 STRL SS (BLADE) ×1 IMPLANT
BLADE SURG 15 STRL LF DISP TIS (BLADE) ×1 IMPLANT
BNDG ELASTIC 4INX 5YD STR LF (GAUZE/BANDAGES/DRESSINGS) IMPLANT
BNDG ELASTIC 6INX 5YD STR LF (GAUZE/BANDAGES/DRESSINGS) IMPLANT
COVER SURGICAL LIGHT HANDLE (MISCELLANEOUS) ×1 IMPLANT
CUFF TOURN SGL QUICK 42 (TOURNIQUET CUFF) IMPLANT
CUFF TRNQT CYL 34X4.125X (TOURNIQUET CUFF) IMPLANT
DRAPE ARTHROSCOPY W/POUCH 114 (DRAPES) ×1 IMPLANT
DRAPE HALF SHEET 40X57 (DRAPES) IMPLANT
DRAPE INCISE IOBAN 66X45 STRL (DRAPES) IMPLANT
DRAPE U-SHAPE 47X51 STRL (DRAPES) ×1 IMPLANT
DRSG TEGADERM 4X4.75 (GAUZE/BANDAGES/DRESSINGS) ×3 IMPLANT
DRSG XEROFORM 1X8 (GAUZE/BANDAGES/DRESSINGS) IMPLANT
DURAPREP 26ML APPLICATOR (WOUND CARE) ×1 IMPLANT
DW OUTFLOW CASSETTE/TUBE SET (MISCELLANEOUS) ×1 IMPLANT
ELECTRODE REM PT RTRN 9FT ADLT (ELECTROSURGICAL) ×1 IMPLANT
GAUZE SPONGE 4X4 12PLY STRL (GAUZE/BANDAGES/DRESSINGS) IMPLANT
GAUZE XEROFORM 1X8 LF (GAUZE/BANDAGES/DRESSINGS) IMPLANT
GLOVE BIO SURGEON STRL SZ 6.5 (GLOVE) ×1 IMPLANT
GLOVE BIOGEL PI IND STRL 6.5 (GLOVE) ×1 IMPLANT
GLOVE BIOGEL PI IND STRL 8 (GLOVE) ×1 IMPLANT
GLOVE ECLIPSE 8.0 STRL XLNG CF (GLOVE) ×1 IMPLANT
GOWN STRL REUS W/ TWL LRG LVL3 (GOWN DISPOSABLE) ×2 IMPLANT
GOWN STRL REUS W/ TWL XL LVL3 (GOWN DISPOSABLE) ×1 IMPLANT
IMMOBILIZER KNEE 24 THIGH 36 (SOFTGOODS) IMPLANT
KIT BASIN OR (CUSTOM PROCEDURE TRAY) ×1 IMPLANT
KIT SUTLOC MENISCAL ROOT REP (Anchor) IMPLANT
KIT TURNOVER KIT B (KITS) ×1 IMPLANT
MANIFOLD NEPTUNE II (INSTRUMENTS) IMPLANT
NDL 18GX1X1/2 (RX/OR ONLY) (NEEDLE) IMPLANT
NDL HYPO 25GX1X1/2 BEV (NEEDLE) ×1 IMPLANT
NDL SUT 2-0 SCORPION KNEE (NEEDLE) IMPLANT
NEEDLE 18GX1X1/2 (RX/OR ONLY) (NEEDLE) IMPLANT
NEEDLE HYPO 25GX1X1/2 BEV (NEEDLE) ×1 IMPLANT
NEEDLE SUT 2-0 SCORPION KNEE (NEEDLE) ×1 IMPLANT
NS IRRIG 1000ML POUR BTL (IV SOLUTION) ×1 IMPLANT
PACK ARTHROSCOPY DSU (CUSTOM PROCEDURE TRAY) ×1 IMPLANT
PAD ARMBOARD POSITIONER FOAM (MISCELLANEOUS) ×2 IMPLANT
PADDING CAST COTTON 6X4 STRL (CAST SUPPLIES) ×1 IMPLANT
PENCIL BUTTON HOLSTER BLD 10FT (ELECTRODE) ×1 IMPLANT
PORT APPOLLO RF 90DEGREE MULTI (SURGICAL WAND) IMPLANT
SOL PREP POV-IOD 4OZ 10% (MISCELLANEOUS) ×1 IMPLANT
SPONGE T-LAP 4X18 ~~LOC~~+RFID (SPONGE) ×1 IMPLANT
SUT ETHILON 3 0 PS 1 (SUTURE) IMPLANT
SUT MENISCAL KIT (KITS) IMPLANT
SUT VIC AB 2-0 CT2 27 (SUTURE) IMPLANT
SUT VIC AB 3-0 SH 27X BRD (SUTURE) IMPLANT
SUTURE FIBERWR 2-0 18 17.9 3/8 (SUTURE) IMPLANT
SUTURE TAPE TIGERLINK 1.3MM BL (SUTURE) IMPLANT
SYR 20ML ECCENTRIC (SYRINGE) ×1 IMPLANT
SYR CONTROL 10ML LL (SYRINGE) IMPLANT
SYR TB 1ML LUER SLIP (SYRINGE) ×1 IMPLANT
TOWEL GREEN STERILE (TOWEL DISPOSABLE) ×1 IMPLANT
TOWEL GREEN STERILE FF (TOWEL DISPOSABLE) ×1 IMPLANT
TUBE CONNECTING 12X1/4 (SUCTIONS) ×1 IMPLANT
TUBING ARTHROSCOPY IRRIG 16FT (MISCELLANEOUS) ×1 IMPLANT
WAND APOLLORF SJ50 AR-9845 (SURGICAL WAND) IMPLANT
WATER STERILE IRR 1000ML POUR (IV SOLUTION) ×1 IMPLANT

## 2024-02-29 NOTE — Anesthesia Procedure Notes (Signed)
 Procedure Name: LMA Insertion Date/Time: 02/29/2024 7:39 AM  Performed by: Jolynn Mage, CRNAPre-anesthesia Checklist: Patient identified, Emergency Drugs available, Suction available and Patient being monitored Patient Re-evaluated:Patient Re-evaluated prior to induction Oxygen Delivery Method: Circle system utilized Preoxygenation: Pre-oxygenation with 100% oxygen Induction Type: IV induction Ventilation: Mask ventilation without difficulty LMA: LMA flexible inserted LMA Size: 5.0 Number of attempts: 1 Placement Confirmation: positive ETCO2 and breath sounds checked- equal and bilateral Tube secured with: Tape Dental Injury: Teeth and Oropharynx as per pre-operative assessment

## 2024-02-29 NOTE — Op Note (Signed)
   02/29/2024  9:22 AM  PATIENT:  Bernett KATHEE Arloa Mickey.  58 y.o. male  PRE-OPERATIVE DIAGNOSIS:  left knee medial meniscal tear  POST-OPERATIVE DIAGNOSIS:  left knee medial meniscal tear,lateral meniscal tear  PROCEDURE:  Procedure(s): ARTHROSCOPY, KNEE, WITH medial MENISCUS REPAIR, lateral meniscal debridement  SURGEON:  Surgeon(s): Addie Cordella Hamilton, MD  ASSISTANT: queen rnfa  ANESTHESIA:   general  EBL: 2 ml    Total I/O In: 1000 [I.V.:900; IV Piggyback:100] Out: 2 [Blood:2]  BLOOD ADMINISTERED: none  DRAINS: none   LOCAL MEDICATIONS USED:  marcaine  morphine  clonidine   SPECIMEN:  No Specimen  COUNTS:  YES  TOURNIQUET:  * Missing tourniquet times found for documented tourniquets in log: [id number] *  DICTATION: .Other Dictation: Dictation Number [id number]  PLAN OF CARE: Discharge to home after PACU  PATIENT DISPOSITION:  PACU - hemodynamically stable

## 2024-02-29 NOTE — Op Note (Signed)
 NAME: Jermaine, Walls MEDICAL RECORD NO: [id number] ACCOUNT NO: [id number] DATE OF BIRTH: 07/19/1966 FACILITY: [facility] LOCATION: [facility] PHYSICIAN: Cordella RAMAN. Addie, MD  Operative Report   DATE OF PROCEDURE: 02/29/2024  PREOPERATIVE DIAGNOSIS:  Left knee medial meniscal root tear.  POSTOPERATIVE DIAGNOSIS:  Left knee medial meniscal root tear with lateral posterior horn meniscal tear.  PROCEDURE:  Left knee arthroscopy with partial lateral meniscectomy and medial meniscal root repair.  SURGEON:  Cordella RAMAN. Addie, MD  ASSISTANT:  Eva Staple, RNFA  INDICATIONS:  The patient is a 58 year old with right knee pain following an injury several months ago.  MRI scan shows large radial tear posterior horn root medial meniscus.  DESCRIPTION OF PROCEDURE:  The patient was brought to the operating room where general anesthetic was induced.  Preoperative antibiotics were administered.  Timeout was called.  The right leg was examined under anesthesia and found to have good stability  to varus and valgus stress at 0, 30, and 90 degrees.  ACL and PCL were intact with no posterolateral rotatory instability.  The left leg was then prescrubbed with alcohol and Betadine  and allowed to air dry, prepped with DuraPrep solution and draped in  a sterile manner.  Timeout was called.  Anterior inferolateral, and anterior inferomedial portal sites were anesthetized using 5 mL of Marcaine  with epinephrine .  The portals were created.  Debridement was performed of the fat pad.  Inspection of the  knee demonstrated a degenerative posterior horn lateral meniscal tear, which was debrided.  Overall, it involved about 10% of the volume of that meniscal attachment at the posterior horn, but it was completely stable.  Articular surface on the lateral  tibial plateau and lateral femoral condyle were intact.  ACL and PCL were intact.  The patient did have grade III to IV changes of the landing zone in the trochlea.  Loose  chondral flaps were debrided from this area as well.  The undersurface of the  patella looked reasonably good.  No loose bodies were noted in the medial or lateral gutter.  There were grade II changes on the medial femoral condyle and medial tibial plateau over about 50% to 60% surface area, worse on the medial femoral condyle.   There was also a meniscal root tear in the back of the posterior horn of the medial meniscus involving about 90% of that attachment.  This area was debrided, but the attachment was maintained.  A smaller cleft tear was also debrided in the body of the  medial meniscus.  At this time, the landing zone was prepped with a MACE rasp.  A good bleeding bony surface was created.  The guide was then placed through the medial portal, and the guide pin was placed into the knee.  This was done through a separate  incision along the medial tibia.  At this time, the all-inside suture repair device was passed, and it was locked nicely below the cortical surface.  Then, the 2 suture limbs were passed into the meniscus and then tightened.  We were able to achieve a  nice repair on the meniscus, bringing it back down to its prepared attachment site.  Taken through a range of motion and found to have good stability.  At this time, thorough irrigation of the knee was performed.  Instruments were removed.  Portals were  closed using 3-0 Vicryl and 3-0 nylon.  The incision site for the drill was closed after we cut the suture flush with the bone.  That was closed using 2-0 Vicryl and 3-0 nylon.  I anesthetized that distal incision on the bone using about 3 mL of the  Marcaine , clonidine , and morphine  solution.  I injected the rest of that, which was about 15 mL into the knee.  Impervious dressings, Ace wrap, and knee immobilizer were placed.  The patient tolerated the procedure well without immediate complications  and transferred to the recovery room in stable condition.   PUS D: 02/29/2024 9:29:48  am T: 02/29/2024 2:56:00 pm  JOB: [id number]/ [id number]

## 2024-02-29 NOTE — Anesthesia Postprocedure Evaluation (Signed)
 Anesthesia Post Note  Patient: Jermaine Walls.  Procedure(s) Performed: ARTHROSCOPY, KNEE, WITH MENISCUS REPAIR (Left: Knee)     Patient location during evaluation: PACU Anesthesia Type: General Level of consciousness: awake and alert Pain management: pain level controlled Vital Signs Assessment: post-procedure vital signs reviewed and stable Respiratory status: spontaneous breathing, nonlabored ventilation and respiratory function stable Cardiovascular status: blood pressure returned to baseline and stable Postop Assessment: no apparent nausea or vomiting Anesthetic complications: no   No notable events documented.  Last Vitals:  Vitals:   02/29/24 1000 02/29/24 1015  BP: 128/81 128/77  Pulse: 73 68  Resp: 15 13  Temp: 36.5 C   SpO2: 95% 94%    Last Pain:  Vitals:   02/29/24 1000  TempSrc:   PainSc: 2                  Sola Margolis,W. EDMOND

## 2024-02-29 NOTE — H&P (Signed)
 Jermaine Walls. is an 58 y.o. male.   Chief Complaint: left knee pain HPI: Jermaine Walls. is a 58 y.o. male who presents to the office reporting left knee pain.  Denies any history of injury.  Symptoms ongoing for 3.5 months.  Reports a lot of pain in the retropatellar region and his knee is essentially locked when he is walking.  Unable to fully extend when he is walking.  Does report swelling and symptoms of instability and giving way.  No prior left knee surgery.  Has taken ibuprofen without much relief.  Has used ice and Voltaren  cream with only minimal relief.  Does have a history of gout but that usually affects his feet and patient describes that this does not feel like a gout attack in the knee. Injection helped only temporarily. Mri shows medial meniscal tear - possible root tear. No personal or family h/o dvt or pe.   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 No longer on medications (02/19/2018)   Pneumonia 1990s X 1   Psoriasis    Toribio Molt, MD dermatology   Small bowel obstruction (HCC) 02/19/2018    Past Surgical History:  Procedure Laterality Date   ESOPHAGOGASTRODUODENOSCOPY (EGD) WITH ESOPHAGEAL DILATION  1976   HERNIA REPAIR     INSERTION OF MESH N/A 10/05/2015   Procedure: INSERTION OF MESH;  Surgeon: Lynda Leos, MD;  Location: [facility] OR;  Service: General;  Laterality: N/A;   LASIK Bilateral 2003   UMBILICAL HERNIA REPAIR N/A 10/05/2015   Procedure: LAPAROSCOPIC UMBILICAL HERNIA REPAIR WITH MESH;  Surgeon: Lynda Leos, MD;  Location: [facility] OR;  Service: General;  Laterality: N/A;    Family History  Problem Relation Age of Onset   Cancer Mother        lung, smoker   Heart disease Father        79, mid to late 36s first bypass. grandfather died at 73.    Diabetes Father    Cancer Sister        lung, smoker   Colon cancer Neg Hx    Esophageal cancer Neg Hx     Liver cancer Neg Hx    Pancreatic cancer Neg Hx    Rectal cancer Neg Hx    Stomach cancer Neg Hx    Social History:  reports that he has never smoked. He has quit using smokeless tobacco.  His smokeless tobacco use included snuff. He reports current alcohol use of about 8.0 standard drinks of alcohol per week. He reports that he does not use drugs.  Allergies:  Allergies  Allergen Reactions   Doxycycline Hyclate Itching and Rash    Facility-Administered Medications Prior to Admission  Medication Dose Route Frequency Provider Last Rate Last Admin   betamethasone  acetate-betamethasone  sodium phosphate  (CELESTONE ) injection 3 mg  3 mg Intramuscular Once Janit Thresa HERO, DPM       betamethasone  acetate-betamethasone  sodium phosphate  (CELESTONE ) injection 3 mg  3 mg Intramuscular Once Evans, Brent M, DPM       Medications Prior to Admission  Medication Sig Dispense Refill   allopurinol  (ZYLOPRIM ) 300 MG tablet Take 1 tablet (300 mg total) by mouth daily. 90 tablet 3   amLODipine  (NORVASC ) 10 MG tablet Take 1 tablet (10 mg total) by mouth every morning. *need office visit* 90 tablet 0   aspirin EC 81 MG  tablet Take 81 mg by mouth daily. Swallow whole.     triamcinolone  cream (KENALOG ) 0.1 % Apply a small amount 2 (two) times daily as needed for psoriasis flare until clear 30 g 0   valsartan  (DIOVAN ) 320 MG tablet Take 1 tablet (320 mg total) by mouth every evening. 90 tablet 2    No results found for this or any previous visit (from the past 48 hours). No results found.  Review of Systems  Musculoskeletal:  Positive for arthralgias.  All other systems reviewed and are negative.   Blood pressure 134/81, pulse 69, temperature 98.6 F (37 C), temperature source Oral, resp. rate 18, height 5' 9 (1.753 m), weight 115.7 kg, SpO2 96%. Physical Exam Vitals reviewed. Exam conducted with a chaperone present.  HENT:     Head: Normocephalic.     Nose: Nose normal.     Mouth/Throat:      Mouth: Mucous membranes are moist.  Eyes:     Pupils: Pupils are equal, round, and reactive to light.  Cardiovascular:     Rate and Rhythm: Normal rate.     Pulses: Normal pulses.  Pulmonary:     Effort: Pulmonary effort is normal.  Abdominal:     General: Abdomen is flat.  Musculoskeletal:     Cervical back: Normal range of motion.  Skin:    General: Skin is warm.     Capillary Refill: Capillary refill takes less than 2 seconds.  Neurological:     General: No focal deficit present.     Mental Status: He is alert.  Psychiatric:        Mood and Affect: Mood normal.    Ortho exam demonstrates antalgic gait to the left.  Knee lacks about 2 degrees of full extension on the left compared to the right.  Collateral cruciate ligaments are stable on the left-hand side.  Pedal pulses palpable.  No groin pain with internal/external rotation of the leg.  No Baker's cyst palpable left knee versus right knee.  No calf tenderness to direct palpation on the medial side.  Slight tenderness on the lateral side but negative Homans.  Flexion range of motion is full on the left.  Extensor mechanism intact.  Does have a little bit more lateral than medial joint line tenderness particularly anterior.   Specialty Comments:  No specialty comments available.   Imaging: XR KNEE 3 VIEW LEFT Result Date: 11/24/2023 AP lateral merchant radiographs left knee reviewed.  Alignment intact.  No acute fracture.  Minimal joint space narrowing on the medial side with no joint space narrowing on the lateral side or patellofemoral compartment.  No spurring present in any of the 3 compartments.  Assessment/Plan Impression is left knee medial meniscal tear.  May be partial versus complete meniscal root tear.  Axial coronal and sagittal MRI scan images are reviewed.  Plan at this time is arthroscopy and meniscal debridement versus meniscal root repair.  The risk and benefits are discussed including not limited to infection nerve  and vessel damage incomplete pain relief as well as potential to develop arthritis in the knee in the future as a result of decreased meniscal function.  All questions answered  KANDICE Glendia Hutchinson, MD 02/29/2024, 6:35 AM

## 2024-02-29 NOTE — Transfer of Care (Signed)
 Immediate Anesthesia Transfer of Care Note  Patient: Jermaine Walls.  Procedure(s) Performed: ARTHROSCOPY, KNEE, WITH MENISCUS REPAIR (Left: Knee)  Patient Location: PACU  Anesthesia Type:General  Level of Consciousness: awake, alert , patient cooperative, and responds to stimulation  Airway & Oxygen Therapy: Patient Spontanous Breathing and Patient connected to face mask oxygen  Post-op Assessment: Report given to RN, Post -op Vital signs reviewed and stable, and Patient moving all extremities  Post vital signs: Reviewed and stable  Last Vitals:  Vitals Value Taken Time  BP 127/76 02/29/24 09:22  Temp 36.5 C 02/29/24 09:22  Pulse 89 02/29/24 09:25  Resp 19 02/29/24 09:25  SpO2 96 % 02/29/24 09:25  Vitals shown include unfiled device data.  Last Pain:  Vitals:   02/29/24 0615  TempSrc:   PainSc: 0-No pain         Complications: No notable events documented.

## 2024-02-29 NOTE — Anesthesia Preprocedure Evaluation (Addendum)
 Anesthesia Evaluation  Patient identified by MRN, date of birth, ID band Patient awake    Reviewed: Allergy & Precautions, H&P , NPO status , Patient's Chart, lab work & pertinent test results  Airway Mallampati: I  TM Distance: >3 FB Neck ROM: Full    Dental no notable dental hx. (+) Teeth Intact, Dental Advisory Given   Pulmonary neg pulmonary ROS   Pulmonary exam normal breath sounds clear to auscultation       Cardiovascular hypertension, Pt. on medications  Rhythm:Regular Rate:Normal     Neuro/Psych negative neurological ROS  negative psych ROS   GI/Hepatic negative GI ROS, Neg liver ROS,,,  Endo/Other    Class 3 obesity  Renal/GU negative Renal ROS  negative genitourinary   Musculoskeletal   Abdominal   Peds  Hematology negative hematology ROS (+)   Anesthesia Other Findings   Reproductive/Obstetrics negative OB ROS                              Anesthesia Physical Anesthesia Plan  ASA: 3  Anesthesia Plan: General   Post-op Pain Management: Tylenol  PO (pre-op)* and Toradol  IV (intra-op)*   Induction: Intravenous  PONV Risk Score and Plan: 3 and Ondansetron , Dexamethasone  and Midazolam   Airway Management Planned: LMA  Additional Equipment:   Intra-op Plan:   Post-operative Plan: Extubation in OR  Informed Consent: I have reviewed the patients History and Physical, chart, labs and discussed the procedure including the risks, benefits and alternatives for the proposed anesthesia with the patient or authorized representative who has indicated his/her understanding and acceptance.     Dental advisory given  Plan Discussed with: CRNA  Anesthesia Plan Comments:          Anesthesia Quick Evaluation

## 2024-03-01 ENCOUNTER — Encounter (HOSPITAL_COMMUNITY): Payer: Self-pay | Admitting: Orthopedic Surgery

## 2024-03-05 ENCOUNTER — Telehealth: Payer: Self-pay | Admitting: Orthopedic Surgery

## 2024-03-07 ENCOUNTER — Ambulatory Visit (INDEPENDENT_AMBULATORY_CARE_PROVIDER_SITE_OTHER): Admitting: Orthopedic Surgery

## 2024-03-07 ENCOUNTER — Telehealth: Payer: Self-pay

## 2024-03-07 ENCOUNTER — Encounter: Payer: Self-pay | Admitting: Orthopedic Surgery

## 2024-03-07 DIAGNOSIS — M25462 Effusion, left knee: Secondary | ICD-10-CM

## 2024-03-07 NOTE — Progress Notes (Signed)
 Post-Op Visit Note   Patient: Jermaine Walls.           Date of Birth: 1965/08/27           MRN: [id number] Visit Date: 03/07/2024 PCP: Dayna Motto, DO   Assessment & Plan:  Chief Complaint:  Chief Complaint  Patient presents with   Left Knee - Routine Post Op    02/29/24 left knee scope with PLM and MMRR   Visit Diagnoses: No diagnosis found.  Plan: Chip is a 58 year old patient is a week out left knee meniscal root repair.  He has been nonweightbearing with a knee immobilizer.  On exam portal incisions are intact.  No calf tenderness negative Homans.  About 30 cc effusion is present which is not particularly symptomatic for him.  He is taking aspirin for DVT prophylaxis but no other real pain medicines.  At this time I think it is okay for him to start bending the knee but not past 90 degrees but continue nonweightbearing for 3 more weeks.  Will see him back in 3 weeks initiate weightbearing and then likely let him do some stationary bike exercises in 3 weeks.  We want to get that meniscal root best chance that early healing into the back of that medial tibial plateau.  May need aspiration of the knee depending on the amount of effusion present at his next clinic visit.  Follow-Up Instructions: No follow-ups on file.   Orders:  No orders of the defined types were placed in this encounter.  No orders of the defined types were placed in this encounter.   Imaging: No results found.  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   COVID-19 virus infection 11/01/2020   Daytime somnolence 11/01/2020   Dysplastic nevus 11/01/2020   Fever blister 11/01/2020   Hypercholesterolemia 11/01/2020   Hyperglycemia 11/01/2020   Idiopathic gout 11/01/2020   Ischemic optic neuropathy of right eye 11/01/2020   Body mass index (BMI) 35.0-35.9, adult  11/01/2020   Prehypertension 11/01/2020   Serum creatinine raised 11/01/2020   Stress 11/01/2020   Aortic atherosclerosis (HCC) 11/01/2020   SBO (small bowel obstruction) (HCC) 02/19/2018   Psoriasis 09/30/2011   Essential hypertension 01/22/2007   Past Medical History:  Diagnosis Date   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 No longer on medications (02/19/2018)   Pneumonia 1990s X 1   Psoriasis    Toribio Molt, MD dermatology   Small bowel obstruction [facility]) 02/19/2018    Family History  Problem Relation Age of Onset   Cancer Mother        lung, smoker   Heart disease Father        60, mid to late 57s first bypass. grandfather died at 25.    Diabetes Father    Cancer Sister        lung, smoker   Colon cancer Neg Hx    Esophageal cancer Neg Hx    Liver cancer Neg Hx    Pancreatic cancer Neg Hx    Rectal cancer Neg Hx    Stomach cancer Neg Hx     Past Surgical History:  Procedure Laterality Date   ESOPHAGOGASTRODUODENOSCOPY (EGD) WITH ESOPHAGEAL DILATION  1976   HERNIA REPAIR     INSERTION OF MESH N/A  10/05/2015   Procedure: INSERTION OF MESH;  Surgeon: Lynda Leos, MD;  Location: [facility] OR;  Service: General;  Laterality: N/A;   KNEE ARTHROSCOPY WITH MENISCAL REPAIR Left 02/29/2024   Procedure: ARTHROSCOPY, KNEE, WITH MENISCUS REPAIR;  Surgeon: Addie Cordella Hamilton, MD;  Location: [facility] OR;  Service: Orthopedics;  Laterality: Left;  LEFT KNEE ARTHROSCOPY WITH LATERAL MENISCUS DEBRIDEMENT AND MEDIAL MENISCUS REPAIR   LASIK Bilateral 2003   UMBILICAL HERNIA REPAIR N/A 10/05/2015   Procedure: LAPAROSCOPIC UMBILICAL HERNIA REPAIR WITH MESH;  Surgeon: Lynda Leos, MD;  Location: [facility] OR;  Service: General;  Laterality: N/A;   Social History   Occupational History   Not on file  Tobacco Use   Smoking status: Never   Smokeless tobacco: Former    Types: Snuff  Vaping Use   Vaping status: Never Used  Substance and Sexual  Activity   Alcohol use: Yes    Alcohol/week: 8.0 standard drinks of alcohol    Types: 8 Cans of beer per week    Comment: occasional beer   Drug use: Never   Sexual activity: Yes    Partners: Female    Comment: monogomous relationship-no STD testing desired

## 2024-03-07 NOTE — Telephone Encounter (Signed)
 Okay to discontinue knee immobilizer but no flexion beyond 90 degrees.

## 2024-03-07 NOTE — Telephone Encounter (Signed)
 Patient needing to know if you want him to continue with immobilizer? If so, for how long?  Please advise.

## 2024-03-17 DIAGNOSIS — S83242S Other tear of medial meniscus, current injury, left knee, sequela: Secondary | ICD-10-CM

## 2024-03-28 ENCOUNTER — Ambulatory Visit: Admitting: Orthopedic Surgery

## 2024-03-28 DIAGNOSIS — M25462 Effusion, left knee: Secondary | ICD-10-CM

## 2024-03-29 ENCOUNTER — Encounter: Payer: Self-pay | Admitting: Orthopedic Surgery

## 2024-03-29 NOTE — Progress Notes (Signed)
 Post-Op Visit Note   Patient: Jermaine Walls.           Date of Birth: [birth year]           MRN: [id number] Visit Date: 03/28/2024 PCP: Dayna Motto, DO   Assessment & Plan:  Chief Complaint:  Chief Complaint  Patient presents with   Left Knee - Routine Post Op         02/29/24 left knee scope with PLM and MMRR     Visit Diagnoses:  1. Effusion, left knee     Plan: Chip is now 4 weeks out left knee arthroscopy with medial meniscal repair.  On exam he is Flexion to about 90 with no calf tenderness negative Homans.  Mild effusion is present which is aspirated today.  Plan at this time is to start partial weightbearing with crutches x 2 over the next week and then go to 1 crutch for 4 to 5 days and then close to 2 weeks from now he should be weightbearing as tolerated.  4-week return for clinical recheck.  Follow-Up Instructions: No follow-ups on file.   Orders:  No orders of the defined types were placed in this encounter.  No orders of the defined types were placed in this encounter.   Imaging: No results found.  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   COVID-19 virus infection 11/01/2020   Daytime somnolence 11/01/2020   Dysplastic nevus 11/01/2020   Fever blister 11/01/2020   Hypercholesterolemia 11/01/2020   Hyperglycemia 11/01/2020   Idiopathic gout 11/01/2020   Ischemic optic neuropathy of right eye 11/01/2020   Body mass index (BMI) 35.0-35.9, adult 11/01/2020   Prehypertension 11/01/2020   Serum creatinine raised 11/01/2020   Stress 11/01/2020   Aortic atherosclerosis (HCC) 11/01/2020   SBO (small bowel obstruction) (HCC) 02/19/2018   Psoriasis 09/30/2011   Essential hypertension 01/22/2007   Past Medical History:  Diagnosis Date   Anterior  ischemic optic neuropathy of right eye    Gout    Hypertension    treated in past. No longer on medications (02/19/2018)   Pneumonia 1990s X 1   Psoriasis    Toribio Molt, MD dermatology   Small bowel obstruction [facility]) 02/19/2018    Family History  Problem Relation Age of Onset   Cancer Mother        lung, smoker   Heart disease Father        74, mid to late 26s first bypass. grandfather died at 20.    Diabetes Father    Cancer Sister        lung, smoker   Colon cancer Neg Hx    Esophageal cancer Neg Hx    Liver cancer Neg Hx    Pancreatic cancer Neg Hx    Rectal cancer Neg Hx    Stomach cancer Neg Hx     Past Surgical History:  Procedure Laterality Date   ESOPHAGOGASTRODUODENOSCOPY (EGD) WITH ESOPHAGEAL DILATION  1976   HERNIA REPAIR     INSERTION OF MESH N/A 10/05/2015   Procedure: INSERTION OF MESH;  Surgeon: Lynda Leos, MD;  Location: [facility] OR;  Service: General;  Laterality: N/A;   KNEE ARTHROSCOPY WITH MENISCAL REPAIR Left 02/29/2024   Procedure: ARTHROSCOPY, KNEE, WITH MENISCUS REPAIR;  Surgeon: Addie Cordella Hamilton, MD;  Location: [facility]  OR;  Service: Orthopedics;  Laterality: Left;  LEFT KNEE ARTHROSCOPY WITH LATERAL MENISCUS DEBRIDEMENT AND MEDIAL MENISCUS REPAIR   LASIK Bilateral 2003   UMBILICAL HERNIA REPAIR N/A 10/05/2015   Procedure: LAPAROSCOPIC UMBILICAL HERNIA REPAIR WITH MESH;  Surgeon: Lynda Leos, MD;  Location: [facility] OR;  Service: General;  Laterality: N/A;   Social History   Occupational History   Not on file  Tobacco Use   Smoking status: Never   Smokeless tobacco: Former    Types: Snuff  Vaping Use   Vaping status: Never Used  Substance and Sexual Activity   Alcohol use: Yes    Alcohol/week: 8.0 standard drinks of alcohol    Types: 8 Cans of beer per week    Comment: occasional beer   Drug use: Never   Sexual activity: Yes    Partners: Female    Comment: monogomous relationship-no STD testing desired

## 2024-04-23 ENCOUNTER — Other Ambulatory Visit (HOSPITAL_COMMUNITY): Payer: Self-pay

## 2024-04-23 MED ORDER — AMLODIPINE BESYLATE 10 MG PO TABS
10.0000 mg | ORAL_TABLET | Freq: Every morning | ORAL | 1 refills | Status: AC
  Filled 2024-04-23: qty 90, 90d supply, fill #0
  Filled 2024-07-24: qty 90, 90d supply, fill #1

## 2024-04-25 ENCOUNTER — Ambulatory Visit (INDEPENDENT_AMBULATORY_CARE_PROVIDER_SITE_OTHER): Admitting: Orthopedic Surgery

## 2024-04-25 DIAGNOSIS — M25462 Effusion, left knee: Secondary | ICD-10-CM

## 2024-04-27 ENCOUNTER — Encounter: Payer: Self-pay | Admitting: Orthopedic Surgery

## 2024-04-27 NOTE — Progress Notes (Signed)
 Post-Op Visit Note   Patient: Jermaine Walls.           Date of Birth: 10/10/1965           MRN: [id number] Visit Date: 04/25/2024 PCP: Dayna Motto, DO   Assessment & Plan:  Chief Complaint:  Chief Complaint  Patient presents with   Other     02/29/24 left knee scope with PLM and MMRR   Visit Diagnoses:  1. Effusion, left knee     Plan: Chip is now 2 months out left knee arthroscopy with medial meniscal root repair.  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  Patient states his knee gets a little stiff with walking but no mechanical symptoms.  He does do the stationary bike occasionally.  [place]  Planning on going back to work on September 10.  Overall his quad strength is improving.  Mild effusion today which is aspirated.  Plan at this time is to continue with quad strengthening exercises.  Okay to return to work on September 10.  74-month return for final check.  Follow-Up Instructions: No follow-ups on file.   Orders:  No orders of the defined types were placed in this encounter.  No orders of the defined types were placed in this encounter.   Imaging: No results found.  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 No longer on medications (02/19/2018)   Pneumonia 1990s X 1   Psoriasis    Toribio Molt, MD dermatology   Small bowel obstruction [facility]) 02/19/2018    Family History  Problem Relation Age of Onset   Cancer Mother        lung, smoker   Heart disease Father        51, mid to late 77s first bypass. grandfather died at 71.    Diabetes Father    Cancer Sister        lung, smoker   Colon cancer Neg Hx    Esophageal cancer Neg Hx    Liver cancer Neg Hx    Pancreatic cancer Neg Hx    Rectal cancer Neg Hx    Stomach cancer Neg Hx     Past Surgical History:  Procedure Laterality Date   ESOPHAGOGASTRODUODENOSCOPY (EGD) WITH ESOPHAGEAL DILATION  1976   HERNIA REPAIR     INSERTION OF MESH N/A 10/05/2015   Procedure: INSERTION OF MESH;  Surgeon: Lynda Leos, MD;  Location: [facility] OR;  Service: General;  Laterality: N/A;   KNEE ARTHROSCOPY WITH MENISCAL REPAIR Left 02/29/2024   Procedure: ARTHROSCOPY, KNEE, WITH MENISCUS REPAIR;  Surgeon: Addie Cordella Hamilton, MD;  Location: [facility] OR;  Service: Orthopedics;  Laterality: Left;  LEFT KNEE ARTHROSCOPY WITH LATERAL MENISCUS DEBRIDEMENT AND MEDIAL MENISCUS REPAIR   LASIK Bilateral 2003   UMBILICAL HERNIA REPAIR N/A 10/05/2015   Procedure: LAPAROSCOPIC UMBILICAL HERNIA REPAIR WITH MESH;  Surgeon: Lynda Leos, MD;  Location: [facility] OR;  Service: General;  Laterality: N/A;   Social History   Occupational History   Not on file  Tobacco Use   Smoking status: Never   Smokeless tobacco: Former    Types: Snuff  Vaping Use   Vaping status: Never Used  Substance and Sexual Activity   Alcohol use: Yes    Alcohol/week: 8.0 standard drinks of alcohol    Types: 8 Cans of beer per week    Comment: occasional beer   Drug use: Never   Sexual activity: Yes    Partners: Female     Comment: monogomous relationship-no STD testing desired

## 2024-05-02 ENCOUNTER — Other Ambulatory Visit (HOSPITAL_COMMUNITY): Payer: Self-pay

## 2024-05-02 MED ORDER — VALSARTAN 320 MG PO TABS
320.0000 mg | ORAL_TABLET | Freq: Every evening | ORAL | 2 refills | Status: AC
  Filled 2024-05-02: qty 90, 90d supply, fill #0
  Filled 2024-08-09: qty 90, 90d supply, fill #1

## 2024-05-29 ENCOUNTER — Other Ambulatory Visit (HOSPITAL_BASED_OUTPATIENT_CLINIC_OR_DEPARTMENT_OTHER): Payer: Self-pay

## 2024-06-24 ENCOUNTER — Encounter: Payer: Self-pay | Admitting: Radiology

## 2024-07-05 ENCOUNTER — Ambulatory Visit (INDEPENDENT_AMBULATORY_CARE_PROVIDER_SITE_OTHER): Admitting: Orthopedic Surgery

## 2024-07-05 DIAGNOSIS — M25462 Effusion, left knee: Secondary | ICD-10-CM

## 2024-07-06 ENCOUNTER — Encounter: Payer: Self-pay | Admitting: Orthopedic Surgery

## 2024-07-06 NOTE — Progress Notes (Signed)
 Post-Op Visit Note   Patient: Jermaine Walls.           Date of Birth: [birth year]           MRN: [id number] Visit Date: 07/05/2024 PCP: Dayna Motto, DO   Assessment & Plan:  Chief Complaint:  Chief Complaint  Patient presents with   Left Knee - Follow-up    02/29/24 left knee scope with PLM and MMRR   Visit Diagnoses: No diagnosis found.  Plan: Patient underwent left knee arthroscopy with medial meniscal root repair and partial lateral meniscectomy.  He is doing well.  Describes muscle pain from walking time to time.  Overall he is having more calf pain and knee pain.  He states his knee is doing well.  On exam he has full range of motion on that left with no effusion.  No nerve root tension signs.  Having some calf pain.  Calf circumference is equal with negative Homans.  Pedal pulses palpable.  Review of left sided knee radiographs demonstrate no abnormality in the tib-fib region.  Plan at this time is heel cord stretching and exercise without jumping or loading the left knee.  I think we should give this about 2 weeks and then decide for or against MRI scanning of that left tib-fib region.  This does not really look like referred pain from the back.  He will let us  know via MyChart message how he wants to proceed in a couple of weeks.  Follow-Up Instructions: No follow-ups on file.   Orders:  No orders of the defined types were placed in this encounter.  No orders of the defined types were placed in this encounter.   Imaging: No results found.  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 No longer on medications (02/19/2018)   Pneumonia 1990s X 1   Psoriasis    Toribio Molt, MD dermatology   Small bowel obstruction [facility]) 02/19/2018    Family History  Problem Relation Age of Onset   Cancer Mother        lung, smoker   Heart disease Father        59, mid to late 80s first bypass. grandfather died at 35.    Diabetes Father    Cancer Sister        lung, smoker   Colon cancer Neg Hx    Esophageal cancer Neg Hx    Liver cancer Neg Hx    Pancreatic cancer Neg Hx    Rectal cancer Neg Hx    Stomach cancer Neg Hx     Past Surgical History:  Procedure Laterality  Date   ESOPHAGOGASTRODUODENOSCOPY (EGD) WITH ESOPHAGEAL DILATION  1976   HERNIA REPAIR     INSERTION OF MESH N/A 10/05/2015   Procedure: INSERTION OF MESH;  Surgeon: Lynda Leos, MD;  Location: [facility] OR;  Service: General;  Laterality: N/A;   KNEE ARTHROSCOPY WITH MENISCAL REPAIR Left 02/29/2024   Procedure: ARTHROSCOPY, KNEE, WITH MENISCUS REPAIR;  Surgeon: Addie Cordella Hamilton, MD;  Location: [facility] OR;  Service: Orthopedics;  Laterality: Left;  LEFT KNEE ARTHROSCOPY WITH LATERAL MENISCUS DEBRIDEMENT AND MEDIAL MENISCUS REPAIR   LASIK Bilateral 2003   UMBILICAL HERNIA REPAIR N/A 10/05/2015   Procedure: LAPAROSCOPIC UMBILICAL HERNIA REPAIR WITH MESH;  Surgeon: Lynda Leos, MD;  Location: [facility] OR;  Service: General;  Laterality: N/A;   Social History   Occupational History   Not on file  Tobacco Use   Smoking status:  Never   Smokeless tobacco: Former    Types: Snuff  Vaping Use   Vaping status: Never Used  Substance and Sexual Activity   Alcohol use: Yes    Alcohol/week: 8.0 standard drinks of alcohol    Types: 8 Cans of beer per week    Comment: occasional beer   Drug use: Never   Sexual activity: Yes    Partners: Female    Comment: monogomous relationship-no STD testing desired

## 2024-09-18 NOTE — Telephone Encounter (Signed)
 Dr Addie called--signing encounter
